# Patient Record
Sex: Male | Born: 1965
Health system: Southern US, Community
[De-identification: ages and names within clinical notes are randomized; demographics above are authoritative.]

## PROBLEM LIST (undated history)

## (undated) DIAGNOSIS — E785 Hyperlipidemia, unspecified: Secondary | ICD-10-CM

## (undated) DIAGNOSIS — I10 Essential (primary) hypertension: Secondary | ICD-10-CM

## (undated) DIAGNOSIS — I739 Peripheral vascular disease, unspecified: Secondary | ICD-10-CM

## (undated) DIAGNOSIS — A630 Anogenital (venereal) warts: Secondary | ICD-10-CM

## (undated) DIAGNOSIS — M8700 Idiopathic aseptic necrosis of unspecified bone: Secondary | ICD-10-CM

## (undated) DIAGNOSIS — J449 Chronic obstructive pulmonary disease, unspecified: Secondary | ICD-10-CM

## (undated) DIAGNOSIS — T7840XA Allergy, unspecified, initial encounter: Secondary | ICD-10-CM

## (undated) HISTORY — DX: Allergy, unspecified, initial encounter: T78.40XA

## (undated) HISTORY — DX: Chronic obstructive pulmonary disease, unspecified: J44.9

## (undated) HISTORY — DX: Peripheral vascular disease, unspecified: I73.9

## (undated) HISTORY — DX: Anogenital (venereal) warts: A63.0

## (undated) HISTORY — DX: Essential (primary) hypertension: I10

## (undated) HISTORY — DX: Hyperlipidemia, unspecified: E78.5

## (undated) HISTORY — PX: HERNIA REPAIR: SHX51

## (undated) SURGERY — Surgical Case
Anesthesia: *Unknown

---
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
## 1998-04-22 ENCOUNTER — Emergency Department (HOSPITAL_COMMUNITY): Admission: EM | Admit: 1998-04-22 | Discharge: 1998-04-22 | Payer: Self-pay | Admitting: Emergency Medicine

## 1998-04-22 ENCOUNTER — Encounter: Payer: Self-pay | Admitting: Emergency Medicine

## 2000-09-11 ENCOUNTER — Observation Stay (HOSPITAL_COMMUNITY): Admission: EM | Admit: 2000-09-11 | Discharge: 2000-09-11 | Payer: Self-pay | Admitting: Emergency Medicine

## 2000-09-11 ENCOUNTER — Encounter: Payer: Self-pay | Admitting: Surgery

## 2001-01-30 ENCOUNTER — Emergency Department (HOSPITAL_COMMUNITY): Admission: AC | Admit: 2001-01-30 | Discharge: 2001-01-30 | Payer: Self-pay

## 2001-02-11 ENCOUNTER — Emergency Department (HOSPITAL_COMMUNITY): Admission: EM | Admit: 2001-02-11 | Discharge: 2001-02-11 | Payer: Self-pay | Admitting: Emergency Medicine

## 2003-07-24 ENCOUNTER — Observation Stay (HOSPITAL_COMMUNITY): Admission: RE | Admit: 2003-07-24 | Discharge: 2003-07-25 | Payer: Self-pay | Admitting: General Surgery

## 2003-09-25 ENCOUNTER — Encounter: Admission: RE | Admit: 2003-09-25 | Discharge: 2003-09-25 | Payer: Self-pay | Admitting: Family Medicine

## 2004-07-17 ENCOUNTER — Ambulatory Visit: Payer: Self-pay | Admitting: Family Medicine

## 2004-08-01 ENCOUNTER — Ambulatory Visit: Payer: Self-pay | Admitting: Family Medicine

## 2004-08-05 ENCOUNTER — Encounter: Admission: RE | Admit: 2004-08-05 | Discharge: 2004-08-05 | Payer: Self-pay | Admitting: Family Medicine

## 2004-09-12 ENCOUNTER — Ambulatory Visit: Payer: Self-pay | Admitting: Family Medicine

## 2005-11-19 ENCOUNTER — Ambulatory Visit: Payer: Self-pay | Admitting: Family Medicine

## 2006-03-24 ENCOUNTER — Ambulatory Visit: Payer: Self-pay | Admitting: Family Medicine

## 2006-03-26 ENCOUNTER — Encounter: Admission: RE | Admit: 2006-03-26 | Discharge: 2006-03-26 | Payer: Self-pay | Admitting: Family Medicine

## 2011-05-26 ENCOUNTER — Encounter (HOSPITAL_COMMUNITY): Payer: Self-pay | Admitting: Pharmacy Technician

## 2011-05-26 ENCOUNTER — Other Ambulatory Visit (HOSPITAL_COMMUNITY): Payer: Self-pay | Admitting: Orthopaedic Surgery

## 2011-05-27 ENCOUNTER — Other Ambulatory Visit (HOSPITAL_COMMUNITY): Payer: Self-pay

## 2011-05-27 ENCOUNTER — Encounter (HOSPITAL_COMMUNITY)
Admission: RE | Admit: 2011-05-27 | Discharge: 2011-05-27 | Disposition: A | Discharge status: Home / Self Care | Payer: BC Managed Care – PPO | Source: Ambulatory Visit | Attending: Orthopaedic Surgery | Admitting: Orthopaedic Surgery

## 2011-05-27 ENCOUNTER — Other Ambulatory Visit: Payer: Self-pay

## 2011-05-27 ENCOUNTER — Ambulatory Visit (HOSPITAL_COMMUNITY)
Admission: RE | Admit: 2011-05-27 | Discharge: 2011-05-27 | Disposition: A | Discharge status: Home / Self Care | Payer: BC Managed Care – PPO | Source: Ambulatory Visit | Attending: Orthopaedic Surgery | Admitting: Orthopaedic Surgery

## 2011-05-27 ENCOUNTER — Encounter (HOSPITAL_COMMUNITY): Payer: Self-pay

## 2011-05-27 DIAGNOSIS — Z01818 Encounter for other preprocedural examination: Secondary | ICD-10-CM | POA: Insufficient documentation

## 2011-05-27 DIAGNOSIS — Z01812 Encounter for preprocedural laboratory examination: Secondary | ICD-10-CM | POA: Insufficient documentation

## 2011-05-27 DIAGNOSIS — Z01810 Encounter for preprocedural cardiovascular examination: Secondary | ICD-10-CM | POA: Insufficient documentation

## 2011-05-27 HISTORY — DX: Idiopathic aseptic necrosis of unspecified bone: M87.00

## 2011-05-27 LAB — CBC
MCH: 29.8 pg (ref 26.0–34.0)
MCHC: 33.4 g/dL (ref 30.0–36.0)
Platelets: 266 10*3/uL (ref 150–400)

## 2011-05-27 LAB — PROTIME-INR
INR: 0.93 (ref 0.00–1.49)
Prothrombin Time: 12.7 seconds (ref 11.6–15.2)

## 2011-05-27 LAB — SURGICAL PCR SCREEN: Staphylococcus aureus: NEGATIVE

## 2011-05-27 LAB — BASIC METABOLIC PANEL
Calcium: 9.9 mg/dL (ref 8.4–10.5)
GFR calc Af Amer: >90 mL/min (ref >90)
GFR calc non Af Amer: 89 mL/min — ABNORMAL LOW (ref >90)
Potassium: 3.8 mEq/L (ref 3.5–5.1)
Sodium: 138 mEq/L (ref 135–145)

## 2011-05-27 LAB — URINALYSIS, ROUTINE W REFLEX MICROSCOPIC
Hgb urine dipstick: NEGATIVE
Leukocytes, UA: NEGATIVE
Nitrite: NEGATIVE
Protein, ur: NEGATIVE mg/dL
Urobilinogen, UA: 0.2 mg/dL (ref 0.0–1.0)

## 2011-05-27 NOTE — Patient Instructions (Signed)
[address] TREVEON BOURCIER  05/27/2011   Your procedure is scheduled on: 05-30-2011  Report to [facility] at 0530 AM.  Call this number if you have problems the morning of surgery: [phone]   Remember:   Do not eat food:After Midnight.  May have clear liquids:until Midnight .  Clear liquids include soda, tea, black coffee, apple or grape juice, broth.  Take these medicines the morning of surgery with A SIP OF WATER: hydrocodone if needed   Do not wear jewelry,.  Do not wear lotions, powders,     Do not bring valuables to the hospital.  Contacts, dentures or bridgework may not be worn into surgery.  Leave suitcase in the car. After surgery it may be brought to your room.  For patients admitted to the hospital, checkout time is 11:00 AM the day of discharge.   Patients discharged the day of surgery will not be allowed to drive home.   Special Instructions: CHG Shower Use Special Wash: 1/2 bottle night before surgery and 1/2 bottle morning of surgery.neck down avoid private area   Please read over the following fact sheets that you were given: MRSA Information  Cain Sieve, rn [facility] pre op nurse phone number [phone]

## 2011-05-30 ENCOUNTER — Encounter (HOSPITAL_COMMUNITY): Payer: Self-pay | Admitting: *Deleted

## 2011-05-30 ENCOUNTER — Inpatient Hospital Stay (HOSPITAL_COMMUNITY): Payer: BC Managed Care – PPO

## 2011-05-30 ENCOUNTER — Inpatient Hospital Stay (HOSPITAL_COMMUNITY): Payer: BC Managed Care – PPO | Admitting: Anesthesiology

## 2011-05-30 ENCOUNTER — Inpatient Hospital Stay (HOSPITAL_COMMUNITY)
Admission: RE | Admit: 2011-05-30 | Discharge: 2011-06-03 | DRG: 818 | Disposition: A | Discharge status: Home Health | Payer: BC Managed Care – PPO | Source: Ambulatory Visit | Attending: Orthopaedic Surgery | Admitting: Orthopaedic Surgery

## 2011-05-30 ENCOUNTER — Encounter (HOSPITAL_COMMUNITY): Payer: Self-pay | Admitting: Anesthesiology

## 2011-05-30 ENCOUNTER — Encounter (HOSPITAL_COMMUNITY)
Admission: RE | Disposition: A | Discharge status: Home Health | Payer: Self-pay | Source: Ambulatory Visit | Attending: Orthopaedic Surgery

## 2011-05-30 DIAGNOSIS — M87051 Idiopathic aseptic necrosis of right femur: Secondary | ICD-10-CM

## 2011-05-30 DIAGNOSIS — M87059 Idiopathic aseptic necrosis of unspecified femur: Principal | ICD-10-CM | POA: Diagnosis present

## 2011-05-30 DIAGNOSIS — F17200 Nicotine dependence, unspecified, uncomplicated: Secondary | ICD-10-CM | POA: Diagnosis present

## 2011-05-30 HISTORY — PX: TOTAL HIP ARTHROPLASTY: SHX124

## 2011-05-30 LAB — TYPE AND SCREEN
ABO/RH(D): A POS
Antibody Screen: NEGATIVE

## 2011-05-30 LAB — ABO/RH: ABO/RH(D): A POS

## 2011-05-30 SURGERY — ARTHROPLASTY, HIP, TOTAL, ANTERIOR APPROACH
Anesthesia: General | Site: Hip | Laterality: Right | Wound class: Clean

## 2011-05-30 MED ORDER — NALOXONE HCL 0.4 MG/ML IJ SOLN: 0.4000 mg | INTRAMUSCULAR | Status: DC | PRN

## 2011-05-30 MED ORDER — METOCLOPRAMIDE HCL 5 MG/ML IJ SOLN: 5.0000 mg | Freq: Three times a day (TID) | INTRAMUSCULAR | Status: DC | PRN

## 2011-05-30 MED ORDER — SENNA 8.6 MG PO TABS
1.0000 | ORAL_TABLET | Freq: Two times a day (BID) | ORAL | Status: DC
  Administered 2011-05-31 – 2011-06-03 (×7): 8.6 mg via ORAL
  Filled 2011-05-30 (×7): qty 1

## 2011-05-30 MED ORDER — HYDROCODONE-ACETAMINOPHEN 5-325 MG PO TABS: 1.0000 | ORAL_TABLET | ORAL | Status: DC | PRN

## 2011-05-30 MED ORDER — ONDANSETRON HCL 4 MG/2ML IJ SOLN: 4.0000 mg | Freq: Four times a day (QID) | INTRAMUSCULAR | Status: DC | PRN

## 2011-05-30 MED ORDER — DIPHENHYDRAMINE HCL 12.5 MG/5ML PO ELIX: 12.5000 mg | ORAL_SOLUTION | Freq: Four times a day (QID) | ORAL | Status: DC | PRN

## 2011-05-30 MED ORDER — MIDAZOLAM HCL 5 MG/5ML IJ SOLN
INTRAMUSCULAR | Status: DC | PRN
  Administered 2011-05-30: 2 mg via INTRAVENOUS

## 2011-05-30 MED ORDER — METOCLOPRAMIDE HCL 10 MG PO TABS: 5.0000 mg | ORAL_TABLET | Freq: Three times a day (TID) | ORAL | Status: DC | PRN

## 2011-05-30 MED ORDER — LACTATED RINGERS IV SOLN
INTRAVENOUS | Status: DC | PRN
  Administered 2011-05-30 (×3): via INTRAVENOUS

## 2011-05-30 MED ORDER — ACETAMINOPHEN 650 MG RE SUPP: 650.0000 mg | Freq: Four times a day (QID) | RECTAL | Status: DC | PRN

## 2011-05-30 MED ORDER — METHOCARBAMOL 500 MG PO TABS
500.0000 mg | ORAL_TABLET | Freq: Four times a day (QID) | ORAL | Status: DC | PRN
  Administered 2011-05-31 – 2011-06-03 (×9): 500 mg via ORAL
  Filled 2011-05-30 (×9): qty 1

## 2011-05-30 MED ORDER — PROPOFOL 10 MG/ML IV EMUL
INTRAVENOUS | Status: DC | PRN
  Administered 2011-05-30: 250 mg via INTRAVENOUS
  Administered 2011-05-30: 50 mg via INTRAVENOUS

## 2011-05-30 MED ORDER — ACETAMINOPHEN 325 MG PO TABS
650.0000 mg | ORAL_TABLET | Freq: Four times a day (QID) | ORAL | Status: DC | PRN
  Administered 2011-05-31 – 2011-06-03 (×2): 650 mg via ORAL
  Filled 2011-05-30 (×2): qty 2

## 2011-05-30 MED ORDER — CEFAZOLIN SODIUM-DEXTROSE 2-3 GM-% IV SOLR
2.0000 g | INTRAVENOUS | Status: AC
  Administered 2011-05-30: 2 g via INTRAVENOUS

## 2011-05-30 MED ORDER — ONDANSETRON HCL 4 MG PO TABS: 4.0000 mg | ORAL_TABLET | Freq: Four times a day (QID) | ORAL | Status: DC | PRN

## 2011-05-30 MED ORDER — PROMETHAZINE HCL 25 MG/ML IJ SOLN: 6.2500 mg | INTRAMUSCULAR | Status: DC | PRN

## 2011-05-30 MED ORDER — ZOLPIDEM TARTRATE 5 MG PO TABS: 5.0000 mg | ORAL_TABLET | Freq: Every evening | ORAL | Status: DC | PRN

## 2011-05-30 MED ORDER — LACTATED RINGERS IV SOLN: INTRAVENOUS | Status: DC

## 2011-05-30 MED ORDER — NICOTINE 21 MG/24HR TD PT24
21.0000 mg | MEDICATED_PATCH | Freq: Every day | TRANSDERMAL | Status: DC
  Administered 2011-05-30 – 2011-06-02 (×4): 21 mg via TRANSDERMAL
  Filled 2011-05-30 (×5): qty 1

## 2011-05-30 MED ORDER — DIPHENHYDRAMINE HCL 50 MG/ML IJ SOLN: 12.5000 mg | Freq: Four times a day (QID) | INTRAMUSCULAR | Status: DC | PRN

## 2011-05-30 MED ORDER — MORPHINE SULFATE (PF) 1 MG/ML IV SOLN
INTRAVENOUS | Status: DC
  Administered 2011-05-30: 16.5 mg via INTRAVENOUS
  Administered 2011-05-30: 12 mg via INTRAVENOUS
  Administered 2011-05-30 (×2): via INTRAVENOUS
  Administered 2011-05-31: 10.5 mg via INTRAVENOUS
  Administered 2011-05-31: 12.95 mg via INTRAVENOUS
  Administered 2011-05-31: 19.5 mg via INTRAVENOUS
  Administered 2011-05-31: 4.5 mg via INTRAVENOUS
  Administered 2011-05-31: 8.51 mg via INTRAVENOUS
  Administered 2011-05-31: 3 mg via INTRAVENOUS
  Administered 2011-06-01: 6 mg via INTRAVENOUS
  Administered 2011-06-01: 25 mg via INTRAVENOUS
  Administered 2011-06-01: 4.5 mg via INTRAVENOUS
  Administered 2011-06-01: 3 mg via INTRAVENOUS
  Administered 2011-06-01: 7.5 mg via INTRAVENOUS
  Administered 2011-06-01: 9 mg via INTRAVENOUS
  Administered 2011-06-02: 07:00:00 via INTRAVENOUS
  Administered 2011-06-02: 3.73 mg via INTRAVENOUS
  Administered 2011-06-02: 3 mg via INTRAVENOUS
  Filled 2011-05-30 (×8): qty 25

## 2011-05-30 MED ORDER — ACETAMINOPHEN 10 MG/ML IV SOLN
INTRAVENOUS | Status: DC | PRN
  Administered 2011-05-30: 1000 mg via INTRAVENOUS

## 2011-05-30 MED ORDER — OXYCODONE HCL 5 MG PO TABS
5.0000 mg | ORAL_TABLET | ORAL | Status: DC | PRN
  Administered 2011-06-02 (×4): 10 mg via ORAL
  Administered 2011-06-02: 5 mg via ORAL
  Administered 2011-06-03 (×2): 10 mg via ORAL
  Filled 2011-05-30 (×7): qty 2

## 2011-05-30 MED ORDER — SODIUM CHLORIDE 0.9 % IJ SOLN: 9.0000 mL | INTRAMUSCULAR | Status: DC | PRN

## 2011-05-30 MED ORDER — PHENOL 1.4 % MT LIQD: 1.0000 | OROMUCOSAL | Status: DC | PRN

## 2011-05-30 MED ORDER — NEOSTIGMINE METHYLSULFATE 1 MG/ML IJ SOLN
INTRAMUSCULAR | Status: DC | PRN
  Administered 2011-05-30: 5 mg via INTRAVENOUS

## 2011-05-30 MED ORDER — MORPHINE SULFATE 2 MG/ML IJ SOLN: 1.0000 mg | INTRAMUSCULAR | Status: DC | PRN

## 2011-05-30 MED ORDER — ROCURONIUM BROMIDE 100 MG/10ML IV SOLN
INTRAVENOUS | Status: DC | PRN
  Administered 2011-05-30 (×2): 20 mg via INTRAVENOUS
  Administered 2011-05-30: 50 mg via INTRAVENOUS

## 2011-05-30 MED ORDER — CEFAZOLIN SODIUM 1-5 GM-% IV SOLN
1.0000 g | Freq: Four times a day (QID) | INTRAVENOUS | Status: AC
  Administered 2011-05-30 (×2): 1 g via INTRAVENOUS
  Filled 2011-05-30 (×3): qty 50

## 2011-05-30 MED ORDER — ALUM & MAG HYDROXIDE-SIMETH 200-200-20 MG/5ML PO SUSP: 30.0000 mL | ORAL | Status: DC | PRN

## 2011-05-30 MED ORDER — DIPHENHYDRAMINE HCL 12.5 MG/5ML PO ELIX
12.5000 mg | ORAL_SOLUTION | ORAL | Status: DC | PRN
  Filled 2011-05-30: qty 10

## 2011-05-30 MED ORDER — MENTHOL 3 MG MT LOZG: 1.0000 | LOZENGE | OROMUCOSAL | Status: DC | PRN

## 2011-05-30 MED ORDER — HYDROMORPHONE HCL PF 1 MG/ML IJ SOLN
0.2500 mg | INTRAMUSCULAR | Status: DC | PRN
  Administered 2011-05-30 (×2): 0.5 mg via INTRAVENOUS

## 2011-05-30 MED ORDER — DEXTROSE 5 % IV SOLN
500.0000 mg | Freq: Four times a day (QID) | INTRAVENOUS | Status: DC | PRN
  Administered 2011-05-30: 500 mg via INTRAVENOUS
  Filled 2011-05-30: qty 5

## 2011-05-30 MED ORDER — ONDANSETRON HCL 4 MG/2ML IJ SOLN
INTRAMUSCULAR | Status: DC | PRN
  Administered 2011-05-30: 4 mg via INTRAVENOUS

## 2011-05-30 MED ORDER — HYDROMORPHONE HCL PF 1 MG/ML IJ SOLN
INTRAMUSCULAR | Status: DC | PRN
  Administered 2011-05-30 (×2): 1 mg via INTRAVENOUS

## 2011-05-30 MED ORDER — RIVAROXABAN 10 MG PO TABS
10.0000 mg | ORAL_TABLET | Freq: Every day | ORAL | Status: DC
  Administered 2011-05-31 – 2011-06-03 (×4): 10 mg via ORAL
  Filled 2011-05-30 (×4): qty 1

## 2011-05-30 MED ORDER — LIDOCAINE HCL (CARDIAC) 20 MG/ML IV SOLN
INTRAVENOUS | Status: DC | PRN
  Administered 2011-05-30: 50 mg via INTRAVENOUS

## 2011-05-30 MED ORDER — SUFENTANIL CITRATE 50 MCG/ML IV SOLN
INTRAVENOUS | Status: DC | PRN
  Administered 2011-05-30: 5 ug via INTRAVENOUS
  Administered 2011-05-30: 15 ug via INTRAVENOUS
  Administered 2011-05-30 (×2): 10 ug via INTRAVENOUS
  Administered 2011-05-30: 15 ug via INTRAVENOUS
  Administered 2011-05-30 (×4): 10 ug via INTRAVENOUS
  Administered 2011-05-30: 5 ug via INTRAVENOUS

## 2011-05-30 MED ORDER — HETASTARCH-ELECTROLYTES 6 % IV SOLN
INTRAVENOUS | Status: DC | PRN
  Administered 2011-05-30: 08:00:00 via INTRAVENOUS

## 2011-05-30 MED ORDER — FERROUS SULFATE 325 (65 FE) MG PO TABS
325.0000 mg | ORAL_TABLET | Freq: Three times a day (TID) | ORAL | Status: DC
  Administered 2011-05-31 – 2011-06-03 (×10): 325 mg via ORAL
  Filled 2011-05-30 (×14): qty 1

## 2011-05-30 MED ORDER — SODIUM CHLORIDE 0.9 % IV SOLN
INTRAVENOUS | Status: DC
  Administered 2011-05-30 – 2011-05-31 (×2): via INTRAVENOUS
  Administered 2011-05-31: 1000 mL via INTRAVENOUS
  Administered 2011-06-01 (×2): via INTRAVENOUS

## 2011-05-30 MED ORDER — GLYCOPYRROLATE 0.2 MG/ML IJ SOLN
INTRAMUSCULAR | Status: DC | PRN
  Administered 2011-05-30: .8 mg via INTRAVENOUS

## 2011-05-30 SURGICAL SUPPLY — 37 items
BAG ZIPLOCK 12X15 (MISCELLANEOUS) ×4 IMPLANT
BLADE SAW SGTL 18X1.27X75 (BLADE) ×2 IMPLANT
CELLS DAT CNTRL 66122 CELL SVR (MISCELLANEOUS) ×1 IMPLANT
CLOTH BEACON ORANGE TIMEOUT ST (SAFETY) ×2 IMPLANT
DRAPE C-ARM 42X72 X-RAY (DRAPES) ×2 IMPLANT
DRAPE STERI IOBAN 125X83 (DRAPES) ×2 IMPLANT
DRAPE U-SHAPE 47X51 STRL (DRAPES) ×6 IMPLANT
DRESSING ALLEVYN BORDER HEEL (GAUZE/BANDAGES/DRESSINGS) ×2 IMPLANT
DRSG MEPILEX BORDER 4X8 (GAUZE/BANDAGES/DRESSINGS) ×2 IMPLANT
ELECT BLADE TIP CTD 4 INCH (ELECTRODE) ×2 IMPLANT
ELECT REM PT RETURN 9FT ADLT (ELECTROSURGICAL) ×2
ELECTRODE REM PT RTRN 9FT ADLT (ELECTROSURGICAL) ×1 IMPLANT
EVACUATOR 1/8 PVC DRAIN (DRAIN) IMPLANT
FACESHIELD LNG OPTICON STERILE (SAFETY) ×8 IMPLANT
GAUZE XEROFORM 1X8 LF (GAUZE/BANDAGES/DRESSINGS) ×2 IMPLANT
GAUZE XEROFORM 5X9 LF (GAUZE/BANDAGES/DRESSINGS) ×2 IMPLANT
GLOVE BIO SURGEON STRL SZ7 (GLOVE) ×2 IMPLANT
GLOVE BIO SURGEON STRL SZ7.5 (GLOVE) ×2 IMPLANT
GLOVE BIOGEL PI IND STRL 7.5 (GLOVE) IMPLANT
GLOVE BIOGEL PI IND STRL 8 (GLOVE) ×1 IMPLANT
GLOVE BIOGEL PI INDICATOR 7.5 (GLOVE)
GLOVE BIOGEL PI INDICATOR 8 (GLOVE) ×1
GLOVE ECLIPSE 7.0 STRL STRAW (GLOVE) ×2 IMPLANT
GOWN STRL REIN XL XLG (GOWN DISPOSABLE) ×4 IMPLANT
KIT BASIN OR (CUSTOM PROCEDURE TRAY) ×2 IMPLANT
PACK TOTAL JOINT (CUSTOM PROCEDURE TRAY) ×2 IMPLANT
PADDING CAST COTTON 6X4 STRL (CAST SUPPLIES) ×2 IMPLANT
RTRCTR WOUND ALEXIS 18CM MED (MISCELLANEOUS) ×2
STAPLER SKIN PROX WIDE 3.9 (STAPLE) IMPLANT
STEM CORAIL KA12 (Stem) ×2 IMPLANT
SUT ETHIBOND NAB CT1 #1 30IN (SUTURE) ×4 IMPLANT
SUT VIC AB 1 CT1 36 (SUTURE) ×4 IMPLANT
SUT VIC AB 2-0 CT1 27 (SUTURE) ×2
SUT VIC AB 2-0 CT1 TAPERPNT 27 (SUTURE) ×2 IMPLANT
TOWEL OR 17X26 10 PK STRL BLUE (TOWEL DISPOSABLE) ×4 IMPLANT
TOWEL OR NON WOVEN STRL DISP B (DISPOSABLE) ×2 IMPLANT
TRAY FOLEY CATH 14FRSI W/METER (CATHETERS) ×2 IMPLANT

## 2011-05-30 NOTE — Anesthesia Postprocedure Evaluation (Signed)
  Anesthesia Post-op Note  Patient: Jerome Thomas  Procedure(s) Performed:  TOTAL HIP ARTHROPLASTY ANTERIOR APPROACH - Right Total Hip Arthroplasty, Direct Anterior Approach (C-Arm)  Patient Location: PACU  Anesthesia Type: General  Level of Consciousness: awake and alert   Airway and Oxygen Therapy: Patient Spontanous Breathing  Post-op Pain: mild  Post-op Assessment: Post-op Vital signs reviewed, Patient's Cardiovascular Status Stable, Respiratory Function Stable, Patent Airway and No signs of Nausea or vomiting  Post-op Vital Signs: stable  Complications: No apparent anesthesia complications

## 2011-05-30 NOTE — Anesthesia Preprocedure Evaluation (Addendum)
Anesthesia Evaluation  Patient identified by MRN, date of birth, ID band Patient awake    Reviewed: Allergy & Precautions, H&P , NPO status , Patient's Chart, lab work & pertinent test results  Airway Mallampati: II TM Distance: >3 FB Neck ROM: full    Dental No notable dental hx. (+) Dental Advisory Given and Teeth Intact   Pulmonary neg pulmonary ROS, Current Smoker,  clear to auscultation  Pulmonary exam normal       Cardiovascular Exercise Tolerance: Good neg cardio ROS regular Normal    Neuro/Psych Negative Neurological ROS  Negative Psych ROS   GI/Hepatic negative GI ROS, Neg liver ROS,   Endo/Other  Negative Endocrine ROS  Renal/GU negative Renal ROS  Genitourinary negative   Musculoskeletal   Abdominal   Peds  Hematology negative hematology ROS (+)   Anesthesia Other Findings   Reproductive/Obstetrics negative OB ROS                          Anesthesia Physical Anesthesia Plan  ASA: II  Anesthesia Plan: General   Post-op Pain Management:    Induction: Intravenous  Airway Management Planned: Oral ETT  Additional Equipment:   Intra-op Plan:   Post-operative Plan:   Informed Consent: I have reviewed the patients History and Physical, chart, labs and discussed the procedure including the risks, benefits and alternatives for the proposed anesthesia with the patient or authorized representative who has indicated his/her understanding and acceptance.   Dental Advisory Given Interior and spatial designer)  Plan Discussed with: CRNA and Surgeon  Anesthesia Plan Comments:       Anesthesia Quick Evaluation

## 2011-05-30 NOTE — Plan of Care (Signed)
Problem: Consults Goal: Diagnosis- Total Joint Replacement Right anterior hip replacement

## 2011-05-30 NOTE — Brief Op Note (Signed)
05/30/2011  9:54 AM  PATIENT:  Anola Gurney  45 y.o. male  PRE-OPERATIVE DIAGNOSIS:  Right hip avascular necrosis  POST-OPERATIVE DIAGNOSIS:  Right hip avascular necrosis  PROCEDURE:  Procedure(s): TOTAL HIP ARTHROPLASTY ANTERIOR APPROACH  SURGEON:  Surgeon(s): Kathryne Hitch  PHYSICIAN ASSISTANT:   ASSISTANTS: none   ANESTHESIA:   general  EBL:  Total I/O In: 2500 [I.V.:2000; IV Piggyback:500] Out: 650 [Urine:150; Blood:500]  BLOOD ADMINISTERED:none  DRAINS: none   LOCAL MEDICATIONS USED:  NONE  SPECIMEN:  No Specimen  DISPOSITION OF SPECIMEN:  N/A  COUNTS:  YES  TOURNIQUET:  * No tourniquets in log *  DICTATION: .Other Dictation: Dictation Number [id number]  PLAN OF CARE: Admit to inpatient   PATIENT DISPOSITION:  PACU - hemodynamically stable.   Delay start of Pharmacological VTE agent (>24hrs) due to surgical blood loss or risk of bleeding:  {YES/NO/NOT APPLICABLE:20182

## 2011-05-30 NOTE — H&P (Signed)
Jerome Thomas is an 45 Thomas.o. male.   Chief Complaint:   Right Hip Pain HPI:   45 yo male with worsening right hip pain for greater than 1 year.  MRI eventually obtained showing avascular necrosis right hip.  With this continuing to get worse and effecting his activities of daily living and quality of life.  He now wishes to proceed with a right total hip replacement.  The risks and benefits have been thoroughly discussed including acute blood loss, nerve injury, DVT and PE.  Our goal is decreased pain and improved function as well as improved quality of life.  Past Medical History  Diagnosis Date  . Avascular necrosis     right hip    Past Surgical History  Procedure Date  . Arthroscopy knee w/ drilling 1980's    left knee  . Hernia repair yrs ago    x 2    History reviewed. No pertinent family history. Social History:  reports that he has been smoking Cigarettes.  He has a 29 pack-year smoking history. He has never used smokeless tobacco. He reports that he drinks alcohol. He reports that he does not use illicit drugs.  Allergies: No Known Allergies  Medications Prior to Admission  Medication Dose Route Frequency Provider Last Rate Last Dose  . ceFAZolin (ANCEF) IVPB 2 g/50 mL premix  2 g Intravenous 60 min Pre-Op Kathryne Hitch       No current outpatient prescriptions on file as of 05/30/2011.    Results for orders placed during the hospital encounter of 05/30/11 (from the past 48 hour(s))  TYPE AND SCREEN     Status: Normal   Collection Time   05/30/11  5:45 AM      Component Value Range Comment   ABO/RH(D) A POS      Antibody Screen NEG      Sample Expiration 06/02/2011     ABO/RH     Status: Normal (Preliminary result)   Collection Time   05/30/11  6:30 AM      Component Value Range Comment   ABO/RH(D) A POS      No results found.  Review of Systems  Musculoskeletal: Positive for joint pain.  All other systems reviewed and are negative.    Blood  pressure 137/89, pulse 91, temperature 97.4 F (36.3 C), temperature source Oral, resp. rate 18, SpO2 97.00%. Physical Exam  Constitutional: He is oriented to person, place, and time. He appears well-developed and well-nourished.  HENT:  Head: Normocephalic and atraumatic.  Eyes: Pupils are equal, round, and reactive to light.  Neck: Normal range of motion. Neck supple.  Cardiovascular: Normal rate and regular rhythm.   Respiratory: Effort normal and breath sounds normal.  GI: Soft. Bowel sounds are normal.  Musculoskeletal:       Right hip: He exhibits decreased range of motion, decreased strength and bony tenderness.  Neurological: He is alert and oriented to person, place, and time.  Skin: Skin is warm and dry.  Psychiatric: He has a normal mood and affect.     Assessment/Plan To the OR today for a right total hip replacement, then admission as inpatient.  Jerome Thomas 05/30/2011, 7:03 AM

## 2011-05-30 NOTE — Transfer of Care (Signed)
Immediate Anesthesia Transfer of Care Note  Patient: Jerome Thomas  Procedure(s) Performed:  TOTAL HIP ARTHROPLASTY ANTERIOR APPROACH - Right Total Hip Arthroplasty, Direct Anterior Approach (C-Arm)  Patient Location: PACU  Anesthesia Type: General  Level of Consciousness: awake, oriented, sedated and responds to stimulation  Airway & Oxygen Therapy: Patient Spontanous Breathing and Patient connected to face mask oxygen  Post-op Assessment: Report given to PACU RN and Post -op Vital signs reviewed and stable  Post vital signs: stable  Complications: No apparent anesthesia complications

## 2011-05-30 NOTE — Op Note (Signed)
Jerome Thomas, Jerome Thomas NO.:  [id number]  MEDICAL RECORD NO.:  [id number]  LOCATION:  1614                         FACILITY:  [facility]  PHYSICIAN:  Vanita Panda. Magnus Ivan, M.D.DATE OF BIRTH:  Feb 05, 1966  DATE OF PROCEDURE:  05/30/2011 DATE OF DISCHARGE:                              OPERATIVE REPORT   PREOPERATIVE DIAGNOSIS:  Avascular necrosis, right hip.  POSTOPERATIVE DIAGNOSIS:  Avascular necrosis, right hip.  PROCEDURE:  Right total hip arthroplasty through direct anterior approach.  IMPLANTS:  DePuy Pinnacle sector acetabular component size 56, size 36 neutral +4 polyethylene liner, size 15 Corail KA femoral component, size 36 + 1.5 ceramic hip ball.  SURGEON:  Vanita Panda. Magnus Ivan, M.D.  ANESTHESIA:  General.  ANTIBIOTICS:  2 g IV Ancef.  BLOOD LOSS:  500 mL.  COMPLICATIONS:  None.  INDICATIONS:  Jerome Thomas is a 45 year old gentleman well known to me.  He has had an excess of 300 pounds and developed right hip pain for over a year.  I saw him in the office and x-rays were consistent with avascular necrosis, but I did obtain an MRI, and I did confirm the diagnosis of avascular necrosis.  He has had a lot of problems working daily with the hip, causing excruciating pain by the end of the day, especially even when he had been driving any type of distances.  It had gotten to where it was repeatedly affecting his activities of daily living and he wished to proceed with a total hip arthroplasty.  He understands the risks and benefits of the surgery and does wish to proceed.  PROCEDURE DESCRIPTION:  After informed consent was obtained, appropriate right hip was marked.  He was brought to the operating room and general anesthesia was obtained while he was on the stretcher.  Foley catheter was placed as well as traction boots on his feet, and he was placed on the Hana fracture table.  Both feet were placed in-line skeletal traction devices, but no  traction was applied.  His right hip was then prepped and draped with DuraPrep and sterile drapes including sterile shower curtain drape.  A time-out was called and he has been identified as the correct patient, correct right hip.  I then made an incision just distal and posterior to the anterior superior iliac spine and carried this obliquely down the leg.  I dissected down to the soft tissues and identified the tensor fascia lata, which I divided longitudinally.  I proceeded with a direct anterior approach to the hip.  Cobra retractors placed around the lateral neck and then up under the rectus femoris. The cobra retractors placed medially.  I cauterized the lateral femoral circumflex vessels, and then divided the hip capsule in a L-shaped format.  I put the Cobra retractors within the hip capsule and then made my femoral neck cut just proximal to the lesser trochanter.  I removed the femoral head in its entirety and found he did have disease consistent with avascular necrosis.  Next, I began preparing the acetabular component of the acetabulum.  I put retractors laterally and medially and cleaned the acetabular debris.  I then began reaming from size 46 reamer, all the way up  to a size 55 with the last few reamers placed under direct fluoroscopic guidance.  I then placed a size 56 acetabular component under direct visualization and fluoroscopy as appropriate possible inclination, abduction, and anteversion.  Next, attention was turned to the femur.  With all traction off the bed, I placed a temporary hook underneath the vastus ridge.  The hip was externally rotated 90 degrees, extended in adduction.  I cleaned the debris around the greater trochanter, put the retractors medially and the tip of the greater trochanter, using a box cutting guide to open the femoral canal.  Then began broaching from a size 8 trial broach all the way to 12, which I felt was stable when we trialled; however,  when last taking out the broach, I felt that we needed to go up the size.  We certainly went up all the way to a size 15, which actually gave a good fill, was felt to be much stable, so we then placed the real size 15 standard offset Corail stem from DePuy.  We replaced the real 36 + 1.5 ceramic hip ball and reduced this in the acetabulum.  The polyethylene liner was placed before this as well.  I was able to measure his leg lengths and they were equal.  He had no Shuck.  We could externally rotate to 90 degrees, internally rotate to 45 degrees.  It was stable. I then copiously irrigated the deep tissues and closed the arthrotomy with interrupted #1 Ethibond suture.  I placed a running #1 Vicryl in the tensor fascia lata, and then closed the subcutaneous tissue with 2-0 Vicryl, and interrupted staples used to close the skin.  The patient was then taken off the Hana table, awakened, extubated, taken to recovery in stable condition.  All final counts correct and no complications noted.     Vanita Panda. Magnus Ivan, M.D.     CYB/MEDQ  D:  05/30/2011  T:  05/30/2011  Job:  [id number]

## 2011-05-31 LAB — BASIC METABOLIC PANEL
CO2: 31 mEq/L (ref 19–32)
Chloride: 98 mEq/L (ref 96–112)
Creatinine, Ser: 0.96 mg/dL (ref 0.50–1.35)
Glucose, Bld: 130 mg/dL — ABNORMAL HIGH (ref 70–99)
Sodium: 134 mEq/L — ABNORMAL LOW (ref 135–145)

## 2011-05-31 LAB — CBC
Hemoglobin: 12.9 g/dL — ABNORMAL LOW (ref 13.0–17.0)
MCV: 91.5 fL (ref 78.0–100.0)
Platelets: 230 10*3/uL (ref 150–400)
RBC: 4.45 MIL/uL (ref 4.22–5.81)
WBC: 10.2 10*3/uL (ref 4.0–10.5)

## 2011-05-31 NOTE — Progress Notes (Signed)
Subjective: 1 Day Post-Op Procedure(s) (LRB): TOTAL HIP ARTHROPLASTY ANTERIOR APPROACH (Right) Patient reports pain as mild.    Objective: Vital signs in last 24 hours: Temp:  [97.3 F (36.3 C)-99.6 F (37.6 C)] 98.7 F (37.1 C) (12/15 0530) Pulse Rate:  [74-105] 78  (12/15 0530) Resp:  [10-20] 20  (12/15 0530) BP: (118-167)/(74-107) 121/75 mmHg (12/15 0530) SpO2:  [92 %-100 %] 99 % (12/15 0530) FiO2 (%):  [98 %] 98 % (12/14 1210) Weight:  [92.987 kg (205 lb)-137.44 kg (303 lb)] 205 lb (92.987 kg) (12/14 1112)  Intake/Output from previous day: 12/14 0701 - 12/15 0700 In: 4893.8 [P.O.:630; I.V.:3613.8; IV Piggyback:650] Out: 1625 [Urine:1125; Blood:500] Intake/Output this shift:     Basename 05/31/11 0430  HGB 12.9*    Basename 05/31/11 0430  WBC 10.2  RBC 4.45  HCT 40.7  PLT 230    Basename 05/31/11 0430  NA 134*  K 4.2  CL 98  CO2 31  BUN 8  CREATININE 0.96  GLUCOSE 130*  CALCIUM 8.5   No results found for this basename: LABPT:2,INR:2 in the last 72 hours  Sensation intact distally Intact pulses distally Incision: no drainage  Assessment/Plan: 1 Day Post-Op Procedure(s) (LRB): TOTAL HIP ARTHROPLASTY ANTERIOR APPROACH (Right) Up with therapy  Jerome Thomas 05/31/2011, 7:45 AM

## 2011-05-31 NOTE — Progress Notes (Signed)
Physical Therapy Evaluation Patient Details Name: Jerome Thomas MRN: [id number] DOB: 1965-09-29 Today's Date: 05/31/2011 1006 - 1040; EVAL Problem List:  Patient Active Problem List  Diagnoses  . Avascular necrosis of right femoral head    Past Medical History:  Past Medical History  Diagnosis Date  . Avascular necrosis     right hip  . No pertinent past medical history    Past Surgical History:  Past Surgical History  Procedure Date  . Arthroscopy knee w/ drilling 1980's    left knee  . Hernia repair yrs ago    x 2    PT Assessment/Plan/Recommendation PT Assessment Clinical Impression Statement: Pt with R THR presents with decreased R LE strength/ROM and decreased functional mobility.  Pt will benefit from skilled PT intervention to maxiimize IND for d/c home with family assist PT Recommendation/Assessment: Patient will need skilled PT in the acute care venue PT Problem List: Decreased strength;Decreased range of motion;Decreased activity tolerance;Decreased mobility;Decreased knowledge of use of DME;Pain PT Therapy Diagnosis : Difficulty walking PT Plan PT Frequency: 7X/week PT Treatment/Interventions: DME instruction;Gait training;Stair training;Functional mobility training;Therapeutic activities;Therapeutic exercise;Patient/family education PT Recommendation Recommendations for Other Services: OT consult Follow Up Recommendations: Home health PT Equipment Recommended: None recommended by PT PT Goals  Acute Rehab PT Goals PT Goal Formulation: With patient Time For Goal Achievement: 7 days Pt will go Supine/Side to Sit: with supervision PT Goal: Supine/Side to Sit - Progress: Not met Pt will go Sit to Supine/Side: with supervision PT Goal: Sit to Supine/Side - Progress: Not met Pt will go Sit to Stand: with supervision PT Goal: Sit to Stand - Progress: Not met Pt will go Stand to Sit: with supervision PT Goal: Stand to Sit - Progress: Not met Pt will Ambulate:  51 - 150 feet;with supervision;with rolling walker PT Goal: Ambulate - Progress: Not met Pt will Go Up / Down Stairs: 1-2 stairs;with min assist;with least restrictive assistive device PT Goal: Up/Down Stairs - Progress: Not met  PT Evaluation Precautions/Restrictions  Restrictions Other Position/Activity Restrictions: WBAT Prior Functioning  Home Living Lives With: Spouse Receives Help From: Family Type of Home: House Home Layout: One level Home Access: Stairs to enter Entrance Stairs-Rails: None Entrance Stairs-Number of Steps: 1 Prior Function Level of Independence: Independent with basic ADLs;Independent with transfers;Requires assistive device for independence Able to Take Stairs?: Yes Cognition Cognition Arousal/Alertness: Awake/alert Overall Cognitive Status: Appears within functional limits for tasks assessed Orientation Level: Oriented X4 Sensation/Coordination Coordination Gross Motor Movements are Fluid and Coordinated: Yes Extremity Assessment RUE Assessment RUE Assessment: Within Functional Limits LUE Assessment LUE Assessment: Within Functional Limits RLE Assessment RLE Assessment: Exceptions to [facility] all ranges secondary to pain) LLE Assessment LLE Assessment: Within Functional Limits Mobility (including Balance) Bed Mobility Bed Mobility: Yes Supine to Sit: 1: +2 Total assist;With rails Supine to Sit Details (indicate cue type and reason): Pt 60% with increased time and cues for technique/sequence Transfers Transfers: Yes Sit to Stand: 1: +2 Total assist;From bed;With upper extremity assist;From elevated surface Sit to Stand Details (indicate cue type and reason): cues for use of UEs and LE position Stand to Sit: 1: +2 Total assist;Without upper extremity assist;With armrests;To chair/3-in-1 Stand to Sit Details: cues for use of UEs and LE position Ambulation/Gait Ambulation/Gait: Yes Ambulation/Gait Assistance: 1: +2 Total  assist Ambulation/Gait Assistance Details (indicate cue type and reason): cues for posture, sequence, and position from RW. - pt 70% with assist required to advance R LE Ambulation Distance (Feet):  8 Feet Assistive device: Rolling walker Gait Pattern: Step-to pattern Gait velocity: slow Stairs: No    Exercise  Total Joint Exercises Ankle Circles/Pumps: AROM;Both;10 reps;Supine Heel Slides: AAROM;15 reps;Supine;Right Hip ABduction/ADduction: AAROM;10 reps;Supine;Right End of Session PT - End of Session Activity Tolerance: Patient limited by pain Patient left: in chair;with call bell in reach;with family/visitor present Nurse Communication: Mobility status for transfers;Mobility status for ambulation General Behavior During Session: [facility] for tasks performed Cognition: [facility] for tasks performed  Shirl Ludington 05/31/2011, 1:13 PM

## 2011-05-31 NOTE — Progress Notes (Signed)
Cm spoke with pt concerning d/c planning. Per pt choice [facility] to provide HHPT. Pt request wide  3n1, shower bench, and RW. Pt's mother to assist in home care.  Genevieve Norlander notified of referral. Elenore Paddy [phone]

## 2011-05-31 NOTE — Progress Notes (Signed)
Physical Therapy Treatment Patient Details Name: Jerome Thomas MRN: [id number] DOB: Oct 18, 1965 Today's Date: 05/31/2011 1430 - 1451; GT PT Assessment/Plan  PT - Assessment/Plan PT Plan: Discharge plan remains appropriate PT Frequency: 7X/week Follow Up Recommendations: Home health PT Equipment Recommended: None recommended by PT PT Goals  Acute Rehab PT Goals Time For Goal Achievement: 7 days Pt will go Supine/Side to Sit: with supervision Pt will go Sit to Supine/Side: with supervision PT Goal: Sit to Supine/Side - Progress: Progressing toward goal Pt will go Sit to Stand: with supervision PT Goal: Sit to Stand - Progress: Progressing toward goal Pt will go Stand to Sit: with supervision PT Goal: Stand to Sit - Progress: Progressing toward goal Pt will Ambulate: 51 - 150 feet;with supervision;with rolling walker PT Goal: Ambulate - Progress: Progressing toward goal  PT Treatment Precautions/Restrictions  Restrictions Weight Bearing Restrictions: No Other Position/Activity Restrictions: WBAT Mobility (including Balance) Bed Mobility Sit to Supine - Right: 3: Mod assist Transfers Sit to Stand: 1: +2 Total assist;With upper extremity assist;With armrests;From chair/3-in-1 Sit to Stand Details (indicate cue type and reason): cues for LE position and use of UEs - pt 60% Stand to Sit: To bed;With upper extremity assist;1: +2 Total assist Stand to Sit Details: cues for LE position and use of UEs - pt 60% Ambulation/Gait Ambulation/Gait Assistance: 3: Mod assist Ambulation/Gait Assistance Details (indicate cue type and reason): cues for posture, sequence, and position from RW. - pt 70% with assist required to advance R LE Ambulation Distance (Feet): 16 Feet Assistive device: Rolling walker Gait Pattern: Step-to pattern Gait velocity: slow    Exercise    End of Session PT - End of Session Activity Tolerance: Patient limited by pain Patient left: in bed;with call bell in  reach;with family/visitor present Nurse Communication: Mobility status for transfers;Mobility status for ambulation General Behavior During Session: [facility] for tasks performed Cognition: [facility] for tasks performed  Iyana Topor 05/31/2011, 4:54 PM

## 2011-05-31 NOTE — Progress Notes (Signed)
Physical Therapy Treatment Patient Details Name: Jerome Thomas MRN: [id number] DOB: [birth year] Today's Date: 05/31/2011  PT Assessment/Plan  PT - Assessment/Plan PT Plan: Discharge plan remains appropriate PT Frequency: 7X/week Follow Up Recommendations: Home health PT Equipment Recommended: None recommended by PT PT Goals  Acute Rehab PT Goals PT Goal Formulation: With patient Time For Goal Achievement: 7 days Pt will go Supine/Side to Sit: with supervision PT Goal: Supine/Side to Sit - Progress: Not met Pt will go Sit to Supine/Side: with supervision PT Goal: Sit to Supine/Side - Progress: Not met Pt will go Sit to Stand: with supervision PT Goal: Sit to Stand - Progress: Progressing toward goal Pt will go Stand to Sit: with supervision PT Goal: Stand to Sit - Progress: Progressing toward goal Pt will Ambulate: 51 - 150 feet;with supervision;with rolling walker PT Goal: Ambulate - Progress: Progressing toward goal Pt will Go Up / Down Stairs: 1-2 stairs;with min assist;with least restrictive assistive device PT Goal: Up/Down Stairs - Progress: Not met  PT Treatment Precautions/Restrictions  Restrictions Weight Bearing Restrictions: No Other Position/Activity Restrictions: WBAT Mobility (including Balance) Transfers Transfers: Yes Sit to Stand: 1: +2 Total assist;With upper extremity assist;With armrests;From chair/3-in-1 Sit to Stand Details (indicate cue type and reason): cues for LE position and use of UEs - pt 60% Stand to Sit: 1: +2 Total assist;With armrests;To chair/3-in-1;With upper extremity assist Stand to Sit Details: cues for LE position and use of UEs - pt 60% Ambulation/Gait Ambulation/Gait: Yes Ambulation/Gait Assistance: 1: +2 Total assist Ambulation/Gait Assistance Details (indicate cue type and reason): cues for posture, sequence, and position from RW. - pt 70% with assist required to advance R LE Ambulation Distance (Feet): 3 Feet Assistive device:  Rolling walker Gait Pattern: Step-to pattern Gait velocity: slow Stairs: No    End of Session PT - End of Session Activity Tolerance: Patient limited by pain Patient left: in chair;with call bell in reach;with family/visitor present Nurse Communication: Mobility status for transfers;Mobility status for ambulation General Behavior During Session: [name] for tasks performed Cognition: [name] for tasks performed  Shivangi Lutz 05/31/2011, 1:18 PM

## 2011-06-01 LAB — CBC
HCT: 36.3 % — ABNORMAL LOW (ref 39.0–52.0)
MCV: 91.9 fL (ref 78.0–100.0)
RBC: 3.95 MIL/uL — ABNORMAL LOW (ref 4.22–5.81)
WBC: 10.3 10*3/uL (ref 4.0–10.5)

## 2011-06-01 NOTE — Progress Notes (Signed)
Physical Therapy Treatment Patient Details Name: GIANNY SABINO MRN: [id number] DOB: 1966-06-16 Today's Date: 06/01/2011 1017 - 1052; GT, TE PT Assessment/Plan  PT - Assessment/Plan PT Plan: Discharge plan remains appropriate PT Frequency: 7X/week Follow Up Recommendations: Home health PT Equipment Recommended: None recommended by PT PT Goals  Acute Rehab PT Goals Time For Goal Achievement: 7 days Pt will go Supine/Side to Sit: with supervision PT Goal: Supine/Side to Sit - Progress: Progressing toward goal Pt will go Sit to Stand: with supervision PT Goal: Sit to Stand - Progress: Progressing toward goal Pt will go Stand to Sit: with supervision PT Goal: Stand to Sit - Progress: Progressing toward goal Pt will Ambulate: 51 - 150 feet;with supervision;with rolling walker PT Goal: Ambulate - Progress: Progressing toward goal  PT Treatment Precautions/Restrictions  Restrictions Weight Bearing Restrictions: No Other Position/Activity Restrictions: WBAT Mobility (including Balance) Bed Mobility Supine to Sit: 1: +2 Total assist;With rails Supine to Sit Details (indicate cue type and reason): Pt 60% with increased time and cues for technique/sequence Transfers Sit to Stand: 1: +2 Total assist;From bed;With upper extremity assist Sit to Stand Details (indicate cue type and reason): cues for use of UEs and for LE position  - pt 70% Stand to Sit: 1: +2 Total assist;With armrests;With upper extremity assist;To chair/3-in-1 Stand to Sit Details: cues for use of UEs and for LE position  - pt 70% Ambulation/Gait Ambulation/Gait Assistance: 3: Mod assist Ambulation/Gait Assistance Details (indicate cue type and reason): cues for use of UEs and for LE position  - pt 70% Ambulation Distance (Feet): 19 Feet Assistive device: Rolling walker Gait Pattern: Step-to pattern Gait velocity: slow    Exercise  Total Joint Exercises Ankle Circles/Pumps: AROM;Both;20 reps;Supine Short Arc  Quad: AAROM;Right;10 reps;Supine Heel Slides: AAROM;15 reps;Supine;Right Hip ABduction/ADduction: 15 reps;AAROM;Right;Supine End of Session PT - End of Session Activity Tolerance: Patient limited by pain;Patient limited by fatigue Patient left: in chair;with call bell in reach;with family/visitor present General Behavior During Session: [facility] for tasks performed Cognition: [facility] for tasks performed  Marshell Dilauro 06/01/2011, 11:53 AM

## 2011-06-01 NOTE — Progress Notes (Signed)
Subjective: 2 Days Post-Op Procedure(s) (LRB): TOTAL HIP ARTHROPLASTY ANTERIOR APPROACH (Right) Patient reports pain as moderate.    Objective: Vital signs in last 24 hours: Temp:  [97.8 F (36.6 C)-99.4 F (37.4 C)] 98.4 F (36.9 C) (12/16 0639) Pulse Rate:  [79-94] 79  (12/16 0639) Resp:  [18] 18  (12/16 0639) BP: (130-161)/(76-84) 130/76 mmHg (12/16 0639) SpO2:  [95 %-100 %] 100 % (12/16 1153)  Intake/Output from previous day: 12/15 0701 - 12/16 0700 In: 800 [P.O.:800] Out: 2550 [Urine:2550] Intake/Output this shift:     Basename 06/01/11 0534 05/31/11 0430  HGB 11.9* 12.9*    Basename 06/01/11 0534 05/31/11 0430  WBC 10.3 10.2  RBC 3.95* 4.45  HCT 36.3* 40.7  PLT 195 230    Basename 05/31/11 0430  NA 134*  K 4.2  CL 98  CO2 31  BUN 8  CREATININE 0.96  GLUCOSE 130*  CALCIUM 8.5   No results found for this basename: LABPT:2,INR:2 in the last 72 hours  Incision: dressing C/D/I  Assessment/Plan: 2 Days Post-Op Procedure(s) (LRB): TOTAL HIP ARTHROPLASTY ANTERIOR APPROACH (Right) Up with therapy  Fredrica Capano Y 06/01/2011, 1:06 PM

## 2011-06-01 NOTE — Progress Notes (Signed)
Physical Therapy Treatment Patient Details Name: Jerome Thomas MRN: [id number] DOB: [birth year] Today's Date: 06/01/2011 1342 - 1410; 2GT PT Assessment/Plan  PT - Assessment/Plan PT Plan: Discharge plan remains appropriate PT Frequency: 7X/week Follow Up Recommendations: Home health PT Equipment Recommended: Rolling walker with 5" wheels (pt reports today that he does not actually have RW at home) PT Goals  Acute Rehab PT Goals Time For Goal Achievement: 7 days Pt will go Supine/Side to Sit: with supervision Pt will go Sit to Supine/Side: with supervision PT Goal: Sit to Supine/Side - Progress: Progressing toward goal Pt will go Sit to Stand: with supervision PT Goal: Sit to Stand - Progress: Progressing toward goal Pt will go Stand to Sit: with supervision PT Goal: Stand to Sit - Progress: Progressing toward goal Pt will Ambulate: 51 - 150 feet;with supervision;with rolling walker PT Goal: Ambulate - Progress: Progressing toward goal  PT Treatment Precautions/Restrictions  Restrictions Weight Bearing Restrictions: No Other Position/Activity Restrictions: WBAT Mobility (including Balance) Bed Mobility Sit to Supine - Right: 1: +2 Total assist Sit to Supine - Right Details (indicate cue type and reason): pt 60% Transfers Sit to Stand: 1: +2 Total assist;With armrests;With upper extremity assist;From chair/3-in-1 Sit to Stand Details (indicate cue type and reason): cues for LE position and use of UEs - pt does much better with R Hand on RW and L hand pushing from chair - pt 80% Stand to Sit: 1: +2 Total assist;To bed;With upper extremity assist Stand to Sit Details: cues for Ue use and LE postion - pt 70% Ambulation/Gait Ambulation/Gait Assistance: 3: Mod assist Ambulation/Gait Assistance Details (indicate cue type and reason): cues for sequence, posture and stride length Ambulation Distance (Feet): 24 Feet Assistive device: Rolling walker Gait Pattern: Step-to  pattern Gait velocity: slow    Exercise    End of Session PT - End of Session Activity Tolerance: Patient limited by pain;Patient limited by fatigue Patient left: in bed;with call bell in reach;with family/visitor present General Behavior During Session: [facility] for tasks performed Cognition: [facility] for tasks performed  Jerome Thomas 06/01/2011, 3:10 PM

## 2011-06-02 LAB — CBC
HCT: 35.8 % — ABNORMAL LOW (ref 39.0–52.0)
Hemoglobin: 11.5 g/dL — ABNORMAL LOW (ref 13.0–17.0)
MCH: 29.4 pg (ref 26.0–34.0)
MCHC: 32.1 g/dL (ref 30.0–36.0)

## 2011-06-02 MED ORDER — FLEET ENEMA 7-19 GM/118ML RE ENEM: 1.0000 | ENEMA | Freq: Every day | RECTAL | Status: DC | PRN

## 2011-06-02 MED ORDER — BISACODYL 10 MG RE SUPP: 10.0000 mg | Freq: Every day | RECTAL | Status: DC | PRN

## 2011-06-02 MED ORDER — BISACODYL 5 MG PO TBEC
5.0000 mg | DELAYED_RELEASE_TABLET | Freq: Every day | ORAL | Status: DC | PRN
  Administered 2011-06-02: 5 mg via ORAL
  Filled 2011-06-02: qty 1

## 2011-06-02 NOTE — Progress Notes (Signed)
Patient ID: Jerome Thomas, male   DOB: 02-11-1966, 45 y.o.   MRN: [id number] Doing well. All vitals stable and labs stable. Right hip stable. Anticipate D/C home tomorrow with HHPT/DME.

## 2011-06-02 NOTE — Progress Notes (Signed)
Physical Therapy Treatment Patient Details Name: Jerome Thomas MRN: [id number] DOB: [birth year] Today's Date: 06/02/2011 14:10 - 14:35 1 gt  1 ta PT Assessment/Plan  PT - Assessment/Plan Comments on Treatment Session: Pt also took a muscle relaxor and feels better PT Plan: Discharge plan remains appropriate Follow Up Recommendations: Home health PT Equipment Recommended: 3 in 1 bedside comode PT Goals  Acute Rehab PT Goals PT Goal Formulation: With patient Pt will go Supine/Side to Sit: with supervision PT Goal: Supine/Side to Sit - Progress: Progressing toward goal Pt will go Sit to Supine/Side: with supervision PT Goal: Sit to Supine/Side - Progress: Progressing toward goal Pt will go Sit to Stand: with supervision PT Goal: Sit to Stand - Progress: Progressing toward goal Pt will go Stand to Sit: with supervision PT Goal: Stand to Sit - Progress: Progressing toward goal Pt will Ambulate: 51 - 150 feet;with rolling walker;with supervision PT Goal: Ambulate - Progress: Progressing toward goal Pt will Go Up / Down Stairs: 1-2 stairs;with min assist;with least restrictive assistive device PT Goal: Up/Down Stairs - Progress: Progressing toward goal  PT Treatment Precautions/Restrictions  Precautions Precaution Comments: none.Marland KitchenMarland KitchenMarland KitchenDirect Anterior Restrictions Weight Bearing Restrictions: No Other Position/Activity Restrictions: WBAT Mobility (including Balance) Bed Mobility Bed Mobility: No Sit to Supine - Right Details (indicate cue type and reason): sitting on EOB with OT in the room Transfers Transfers: Yes Sit to Stand: 3: Mod assist Sit to Stand Details (indicate cue type and reason): Mod assist + 2 for safeyt 50% VC's on hand placement Stand to Sit: 3: Mod assist Stand to Sit Details: 50% VC's on hand placement and increased time Ambulation/Gait Ambulation/Gait: Yes Ambulation/Gait Assistance: 4: Min assist Ambulation/Gait Assistance Details (indicate cue type and  reason): 2nd assist following with chair Ambulation Distance (Feet): 55 Feet Assistive device: Rolling walker Gait Pattern: Step-to pattern Gait velocity: instructed pt to advance non operative LE first to decreaase R LR hip pain Stairs: No Wheelchair Mobility Wheelchair Mobility: No    Exercise    End of Session PT - End of Session Equipment Utilized During Treatment: Gait belt Activity Tolerance: Patient tolerated treatment well Patient left: in chair;with call bell in reach;with family/visitor present General Behavior During Session: [facility] for tasks performed Cognition: [facility] for tasks performed  Felecia Shelling PTA [facility]  Acute  Rehab Pager     [phone]

## 2011-06-02 NOTE — Progress Notes (Signed)
Physical Therapy Treatment Patient Details Name: Jerome Thomas MRN: [id number] DOB: Oct 31, 1965 Today's Date: 06/02/2011 13;10 - 13:30 1 gt PT Assessment/Plan  PT - Assessment/Plan Comments on Treatment Session: Pt plans to D/C to home with spouse.  Pt likes his new beri walker much better.  Thank You. PT Plan: Discharge plan remains appropriate Follow Up Recommendations: Home health PT Equipment Recommended: 3 in 1 bedside comode PT Goals  Acute Rehab PT Goals PT Goal Formulation: With patient Pt will go Supine/Side to Sit: with supervision PT Goal: Supine/Side to Sit - Progress: Progressing toward goal Pt will go Sit to Supine/Side: with supervision PT Goal: Sit to Supine/Side - Progress: Progressing toward goal Pt will go Sit to Stand: with supervision PT Goal: Sit to Stand - Progress: Progressing toward goal Pt will go Stand to Sit: with supervision PT Goal: Stand to Sit - Progress: Progressing toward goal Pt will Ambulate: 51 - 150 feet;with rolling walker PT Goal: Ambulate - Progress: Progressing toward goal Pt will Go Up / Down Stairs: 1-2 stairs;with min assist;with least restrictive assistive device PT Goal: Up/Down Stairs - Progress: Progressing toward goal  PT Treatment Precautions/Restrictions  Precautions Precaution Comments: none.Marland KitchenMarland KitchenMarland KitchenDirect Anterior Restrictions Weight Bearing Restrictions: No Other Position/Activity Restrictions: WBAT Mobility (including Balance) Bed Mobility Bed Mobility: No Sit to Supine - Right Details (indicate cue type and reason): sitting on EOB with OT in the room Transfers Transfers: Yes Sit to Stand: 3: Mod assist Sit to Stand Details (indicate cue type and reason): Mod assist + 2 for safeyt 50% VC's on hand placement Stand to Sit: 3: Mod assist Stand to Sit Details: 50% VC's on hand placement and increased time Ambulation/Gait Ambulation/Gait: Yes Ambulation/Gait Assistance: 3: Mod assist Ambulation/Gait Assistance Details  (indicate cue type and reason): 50% VC's to advance non operative LE first to decrease pain @ R ight frontal hip area Ambulation Distance (Feet): 25 Feet Assistive device: Rolling walker Gait Pattern: Step-through pattern Gait velocity: required increased time and chair to follow for safety Stairs: No Wheelchair Mobility Wheelchair Mobility: No    Exercise    End of Session PT - End of Session Equipment Utilized During Treatment: Gait belt Activity Tolerance: Patient tolerated treatment well Patient left: in chair General Behavior During Session: [facility] for tasks performed Cognition: [facility] for tasks performed  Felecia Shelling PTA [facility]  Acute  Rehab Pager     [phone]

## 2011-06-02 NOTE — Progress Notes (Signed)
Occupational Therapy Evaluation Patient Details Name: Jerome Thomas MRN: [id number] DOB: [birth year] Today's Date: 06/02/2011  Problem List:  Patient Active Problem List  Diagnoses  . Avascular necrosis of right femoral head    Past Medical History:  Past Medical History  Diagnosis Date  . Avascular necrosis     right hip  . No pertinent past medical history    Past Surgical History:  Past Surgical History  Procedure Date  . Arthroscopy knee w/ drilling 1980's    left knee  . Hernia repair yrs ago    x 2    OT Assessment/Plan/Recommendation OT Assessment Clinical Impression Statement: Pt presents to OT to increase I with ADL activity and decrease burden on wife OT Recommendation/Assessment: Patient will need skilled OT in the acute care venue OT Problem List: Decreased strength;Pain OT Therapy Diagnosis : Generalized weakness;Acute pain OT Plan OT Frequency: Min 2X/week OT Treatment/Interventions: Self-care/ADL training;DME and/or AE instruction;Patient/family education OT Recommendation Follow Up Recommendations: Home health OT Equipment Recommended: 3 in 1 bedside comode Individuals Consulted Consulted and Agree with Results and Recommendations: Patient;Family member/caregiver OT Goals Acute Rehab OT Goals OT Goal Formulation: With patient Time For Goal Achievement: 2 weeks ADL Goals Pt Will Perform Lower Body Dressing: with min assist;Sit to stand from chair ADL Goal: Lower Body Dressing - Progress: Progressing toward goals Pt Will Transfer to Toilet: with supervision;Comfort height toilet ADL Goal: Toilet Transfer - Progress: Met  OT Evaluation Precautions/Restrictions  Restrictions Weight Bearing Restrictions: No Other Position/Activity Restrictions: WBAT Prior Functioning Home Living Bathroom Shower/Tub: Tub/shower unit;Walk-in shower Bathroom Toilet: Standard Prior Function Level of Independence: Independent with basic ADLs ADL ADL Grooming:  Simulated;Supervision/safety Where Assessed - Grooming: Sitting, bed;Unsupported Upper Body Bathing: Performed;Set up Where Assessed - Upper Body Bathing: Sitting, bed;Unsupported Lower Body Bathing: Simulated;Maximal assistance Where Assessed - Lower Body Bathing: Sit to stand from bed Upper Body Dressing: Performed;Minimal assistance Where Assessed - Upper Body Dressing: Sitting, bed;Unsupported Lower Body Dressing: Maximal assistance Where Assessed - Lower Body Dressing: Sit to stand from bed Toilet Transfer: Not assessed Tub/Shower Transfer: Not assessed    Extremity Assessment RUE Assessment RUE Assessment: Within Functional Limits LUE Assessment LUE Assessment: Within Functional Limits    End of Session OT - End of Session Equipment Utilized During Treatment: Gait belt Activity Tolerance: Patient tolerated treatment well Patient left:  (with PT) General Behavior During Session: [facility] for tasks performed   Kirt Boys 06/02/2011, 1:50 PM

## 2011-06-03 ENCOUNTER — Encounter (HOSPITAL_COMMUNITY): Payer: Self-pay | Admitting: Orthopaedic Surgery

## 2011-06-03 MED ORDER — RIVAROXABAN 10 MG PO TABS: 10.0000 mg | ORAL_TABLET | Freq: Every day | ORAL | Status: DC

## 2011-06-03 MED ORDER — OXYCODONE HCL 5 MG PO TABS: 5.0000 mg | ORAL_TABLET | ORAL | Status: AC | PRN

## 2011-06-03 MED ORDER — METHOCARBAMOL 500 MG PO TABS: 500.0000 mg | ORAL_TABLET | Freq: Four times a day (QID) | ORAL | Status: AC | PRN

## 2011-06-03 NOTE — Discharge Summary (Signed)
Physician Discharge Summary  Patient ID: Jerome Thomas MRN: [id number] DOB/AGE: 1965/10/24 45 y.o.  Admit date: 05/30/2011 Discharge date: 06/03/2011  Admission Diagnoses:  Avascular necrosis of right femoral head  Discharge Diagnoses:  Principal Problem:  *Avascular necrosis of right femoral head   Past Medical History  Diagnosis Date  . Avascular necrosis     right hip  . No pertinent past medical history     Surgeries: Procedure(s): TOTAL HIP ARTHROPLASTY ANTERIOR APPROACH on 05/30/2011   Consultants (if any):    Discharged Condition: Improved  Hospital Course: Jerome Thomas is an 45 y.o. male who was admitted 05/30/2011 with a diagnosis of Avascular necrosis of right femoral head and went to the operating room on 05/30/2011 and underwent the above named procedures.    He was given perioperative antibiotics:  Anti-infectives     Start     Dose/Rate Route Frequency Ordered Stop   05/30/11 1100   ceFAZolin (ANCEF) IVPB 1 g/50 mL premix        1 g 100 mL/hr over 30 Minutes Intravenous Every 6 hours 05/30/11 0952 05/31/11 0129   05/30/11 0615   ceFAZolin (ANCEF) IVPB 2 g/50 mL premix        2 g 100 mL/hr over 30 Minutes Intravenous 60 min pre-op 05/30/11 0609 05/30/11 0725        .  He was given sequential compression devices, early ambulation, and chemoprophylaxis for DVT prophylaxis.  He benefited maximally from their hospital stay and there were no complications.    Recent vital signs:  Filed Vitals:   06/03/11 0601  BP: 125/69  Pulse: 89  Temp: 98 F (36.7 C)  Resp: 18    Recent laboratory studies:  Lab Results  Component Value Date   HGB 11.5* 06/02/2011   HGB 11.9* 06/01/2011   HGB 12.9* 05/31/2011   Lab Results  Component Value Date   WBC 9.1 06/02/2011   PLT 220 06/02/2011   Lab Results  Component Value Date   INR 0.93 05/27/2011   Lab Results  Component Value Date   NA 134* 05/31/2011   K 4.2 05/31/2011   CL 98  05/31/2011   CO2 31 05/31/2011   BUN 8 05/31/2011   CREATININE 0.96 05/31/2011   GLUCOSE 130* 05/31/2011    Discharge Medications:   Current Discharge Medication List    START taking these medications   Details  methocarbamol (ROBAXIN) 500 MG tablet Take 1 tablet (500 mg total) by mouth every 6 (six) hours as needed. Qty: 60 tablet, Refills: 0    oxyCODONE (OXY IR/ROXICODONE) 5 MG immediate release tablet Take 1-2 tablets (5-10 mg total) by mouth every 4 (four) hours as needed. Qty: 60 tablet, Refills: 0    rivaroxaban (XARELTO) 10 MG TABS tablet Take 1 tablet (10 mg total) by mouth daily with breakfast. Qty: 20 tablet, Refills: 0      STOP taking these medications     HYDROcodone-acetaminophen (NORCO) 5-325 MG per tablet         Diagnostic Studies: Dg Chest 2 View  05/27/2011  *RADIOLOGY REPORT*  Clinical Data: Preop total hip arthroplasty  CHEST - 2 VIEW  Comparison: Chest radiograph 08/05/2004  Findings: Normal heart size.  Mediastinal and hilar contours are stable.  Trachea is midline.  The lungs are clear.  Negative for pleural effusion or pneumothorax.  No acute bony abnormality.  IMPRESSION: Stable chest.  No acute findings.  Original Report Authenticated By: Britta Mccreedy, M.D.  Dg Hip Complete Right  05/30/2011  *RADIOLOGY REPORT*  Clinical Data: Right total hip replacement  RIGHT HIP - COMPLETE 2+ VIEW  Comparison: Correlation with lateral film performed today  Findings: A right total hip prosthesis is in place and anatomically aligned.  There is no evidence of fracture.  No peri hardware lucency.  IMPRESSION: Right total hip prosthesis is anatomically aligned.  No evidence of complication.  Original Report Authenticated By: Brandon Melnick, M.D.   Dg Pelvis Portable  05/30/2011  *RADIOLOGY REPORT*  Clinical Data: Hip replacement.  PORTABLE PELVIS  Comparison: None.  Findings: Right total hip arthroplasty is in place.  The device is located.  No fracture is identified.   Mild appearing left hip degenerative disease is noted.  IMPRESSION: Right total hip without evidence of complication.  Original Report Authenticated By: Bernadene Bell. D'ALESSIO, M.D.   Dg Hip Portable 1 View Right  05/30/2011  *RADIOLOGY REPORT*  Clinical Data: Hip replacement.  PORTABLE RIGHT HIP - 1 VIEW  Comparison: None.  Findings: New right total hip arthroplasty is in place.  The device is located.  No fracture is identified.  Surgical staples noted.  IMPRESSION: Right total hip without evidence of complication.  Original Report Authenticated By: Bernadene Bell. D'ALESSIO, M.D.   Dg C-arm 61-120 Min-no Report  05/30/2011  CLINICAL DATA: intra op   C-ARM 61-120 MINUTES  Fluoroscopy was utilized by the requesting physician.  No radiographic  interpretation.      Disposition: to home       Signed: Kathryne Hitch 06/03/2011, 6:40 AM

## 2011-06-03 NOTE — Progress Notes (Signed)
Physical Therapy Treatment Patient Details Name: Jerome Thomas MRN: [id number] DOB: 1966-01-15 Today's Date: 06/03/2011 9:15 - 9:55 2 te  1 ta PT Assessment/Plan  PT - Assessment/Plan Comments on Treatment Session: Pt feels much better and plans to D/C to home today PT Plan: Discharge plan remains appropriate Follow Up Recommendations: Home health PT PT Goals   see PT eval  PT Treatment Precautions/Restrictions  Precautions Precaution Comments: none.Marland KitchenMarland KitchenMarland KitchenDirect Anterior Restrictions Weight Bearing Restrictions: No Other Position/Activity Restrictions: WBAT Mobility (including Balance) Bed Mobility Sit to Supine - Right Details (indicate cue type and reason): instructed pt how to use a leg lifter to assist R LE up in bed plus Pt required increased time Transfers Transfers: Yes Sit to Stand: 5: Supervision;From chair/3-in-1 Sit to Stand Details (indicate cue type and reason): One VC on trun completion prior to sit Stand to Sit: 5: Supervision;To bed Stand to Sit Details: One VC on hand placement and controlled decline Ambulation/Gait Ambulation/Gait: No Ambulation/Gait Assistance Details (indicate cue type and reason): focus this session was HEP    Exercise  Total Joint Exercises Ankle Circles/Pumps: AROM;Both;10 reps Quad Sets: AROM;Both;10 reps Gluteal Sets: AROM;Both;10 reps Towel Squeeze: AROM;Both;10 reps Short Arc Quad: AROM;10 reps;Right Heel Slides: AROM;10 reps;Right Hip ABduction/ADduction: AROM;10 reps;Right Long Arc Quad: AROM;10 reps;Right Knee Flexion: AAROM;Right;10 reps Marching in Standing: AAROM;Right;10 reps Standing Hip Extension: Right;10 reps End of Session PT - End of Session Equipment Utilized During Treatment: Gait belt Activity Tolerance: Patient tolerated treatment well Patient left: in bed General Behavior During Session: [facility] for tasks performed Cognition: [facility] for tasks performed Felecia Shelling PTA [facility]  Acute  Rehab Pager      [phone]

## 2011-06-03 NOTE — Progress Notes (Signed)
Discharge summary sent to payer through [facility]  

## 2012-08-04 ENCOUNTER — Inpatient Hospital Stay (HOSPITAL_COMMUNITY)
Admission: EM | Admit: 2012-08-04 | Discharge: 2012-08-07 | DRG: 090 | Disposition: A | Discharge status: Home / Self Care | Payer: BC Managed Care – PPO | Attending: Internal Medicine | Admitting: Internal Medicine

## 2012-08-04 ENCOUNTER — Emergency Department (HOSPITAL_COMMUNITY): Payer: BC Managed Care – PPO

## 2012-08-04 ENCOUNTER — Encounter (HOSPITAL_COMMUNITY): Payer: Self-pay | Admitting: Emergency Medicine

## 2012-08-04 DIAGNOSIS — D72829 Elevated white blood cell count, unspecified: Secondary | ICD-10-CM | POA: Diagnosis not present

## 2012-08-04 DIAGNOSIS — J111 Influenza due to unidentified influenza virus with other respiratory manifestations: Secondary | ICD-10-CM

## 2012-08-04 DIAGNOSIS — Z79899 Other long term (current) drug therapy: Secondary | ICD-10-CM

## 2012-08-04 DIAGNOSIS — R0602 Shortness of breath: Secondary | ICD-10-CM

## 2012-08-04 DIAGNOSIS — B349 Viral infection, unspecified: Secondary | ICD-10-CM

## 2012-08-04 DIAGNOSIS — R0902 Hypoxemia: Secondary | ICD-10-CM

## 2012-08-04 DIAGNOSIS — R000 Tachycardia, unspecified: Secondary | ICD-10-CM

## 2012-08-04 DIAGNOSIS — Z96649 Presence of unspecified artificial hip joint: Secondary | ICD-10-CM

## 2012-08-04 DIAGNOSIS — J189 Pneumonia, unspecified organism: Principal | ICD-10-CM

## 2012-08-04 DIAGNOSIS — R062 Wheezing: Secondary | ICD-10-CM

## 2012-08-04 DIAGNOSIS — Z792 Long term (current) use of antibiotics: Secondary | ICD-10-CM

## 2012-08-04 DIAGNOSIS — J180 Bronchopneumonia, unspecified organism: Secondary | ICD-10-CM | POA: Diagnosis present

## 2012-08-04 DIAGNOSIS — J209 Acute bronchitis, unspecified: Secondary | ICD-10-CM | POA: Diagnosis present

## 2012-08-04 DIAGNOSIS — F17200 Nicotine dependence, unspecified, uncomplicated: Secondary | ICD-10-CM | POA: Diagnosis present

## 2012-08-04 DIAGNOSIS — T380X5A Adverse effect of glucocorticoids and synthetic analogues, initial encounter: Secondary | ICD-10-CM | POA: Diagnosis not present

## 2012-08-04 LAB — CBC WITH DIFFERENTIAL/PLATELET
HCT: 50.9 % (ref 39.0–52.0)
Hemoglobin: 17.3 g/dL — ABNORMAL HIGH (ref 13.0–17.0)
Lymphocytes Relative: 15 % (ref 12–46)
Lymphs Abs: 1.6 10*3/uL (ref 0.7–4.0)
Monocytes Absolute: 1 10*3/uL (ref 0.1–1.0)
Monocytes Relative: 9 % (ref 3–12)
Neutro Abs: 8 10*3/uL — ABNORMAL HIGH (ref 1.7–7.7)
RBC: 5.81 MIL/uL (ref 4.22–5.81)
WBC: 10.7 10*3/uL — ABNORMAL HIGH (ref 4.0–10.5)

## 2012-08-04 LAB — URINALYSIS, ROUTINE W REFLEX MICROSCOPIC
Protein, ur: 30 mg/dL — AB
Urobilinogen, UA: 1 mg/dL (ref 0.0–1.0)

## 2012-08-04 LAB — COMPREHENSIVE METABOLIC PANEL
AST: 20 U/L (ref 0–37)
Alkaline Phosphatase: 101 U/L (ref 39–117)
BUN: 14 mg/dL (ref 6–23)
CO2: 26 mEq/L (ref 19–32)
Chloride: 94 mEq/L — ABNORMAL LOW (ref 96–112)
Creatinine, Ser: 1.05 mg/dL (ref 0.50–1.35)
GFR calc non Af Amer: 83 mL/min — ABNORMAL LOW (ref >90)
Total Bilirubin: 0.8 mg/dL (ref 0.3–1.2)

## 2012-08-04 LAB — URINE MICROSCOPIC-ADD ON

## 2012-08-04 MED ORDER — ACETAMINOPHEN 325 MG PO TABS: 650.0000 mg | ORAL_TABLET | Freq: Four times a day (QID) | ORAL | Status: DC | PRN

## 2012-08-04 MED ORDER — IPRATROPIUM BROMIDE 0.02 % IN SOLN
0.5000 mg | Freq: Once | RESPIRATORY_TRACT | Status: AC
  Administered 2012-08-04: 0.5 mg via RESPIRATORY_TRACT
  Filled 2012-08-04: qty 2.5

## 2012-08-04 MED ORDER — ALBUTEROL SULFATE (5 MG/ML) 0.5% IN NEBU
2.5000 mg | INHALATION_SOLUTION | Freq: Three times a day (TID) | RESPIRATORY_TRACT | Status: DC
  Administered 2012-08-05 – 2012-08-07 (×7): 2.5 mg via RESPIRATORY_TRACT
  Filled 2012-08-04 (×5): qty 0.5

## 2012-08-04 MED ORDER — ALBUTEROL SULFATE (5 MG/ML) 0.5% IN NEBU: 2.5000 mg | INHALATION_SOLUTION | RESPIRATORY_TRACT | Status: DC | PRN

## 2012-08-04 MED ORDER — METHYLPREDNISOLONE SODIUM SUCC 125 MG IJ SOLR
60.0000 mg | INTRAMUSCULAR | Status: DC
  Administered 2012-08-04 – 2012-08-05 (×3): 60 mg via INTRAVENOUS
  Filled 2012-08-04 (×9): qty 0.96
  Filled 2012-08-04: qty 2

## 2012-08-04 MED ORDER — ONDANSETRON HCL 4 MG PO TABS: 4.0000 mg | ORAL_TABLET | Freq: Four times a day (QID) | ORAL | Status: DC | PRN

## 2012-08-04 MED ORDER — SODIUM CHLORIDE 0.9 % IV BOLUS (SEPSIS)
1000.0000 mL | Freq: Once | INTRAVENOUS | Status: AC
  Administered 2012-08-04: 1000 mL via INTRAVENOUS

## 2012-08-04 MED ORDER — HYDROCODONE-ACETAMINOPHEN 5-325 MG PO TABS
1.0000 | ORAL_TABLET | ORAL | Status: DC | PRN
  Administered 2012-08-06: 2 via ORAL
  Filled 2012-08-04: qty 2

## 2012-08-04 MED ORDER — ONDANSETRON HCL 4 MG/2ML IJ SOLN: 4.0000 mg | Freq: Four times a day (QID) | INTRAMUSCULAR | Status: DC | PRN

## 2012-08-04 MED ORDER — DOCUSATE SODIUM 100 MG PO CAPS
100.0000 mg | ORAL_CAPSULE | Freq: Two times a day (BID) | ORAL | Status: DC
  Administered 2012-08-04 – 2012-08-07 (×5): 100 mg via ORAL
  Filled 2012-08-04 (×7): qty 1

## 2012-08-04 MED ORDER — ALBUTEROL SULFATE (5 MG/ML) 0.5% IN NEBU
2.5000 mg | INHALATION_SOLUTION | Freq: Four times a day (QID) | RESPIRATORY_TRACT | Status: DC
  Administered 2012-08-04: 2.5 mg via RESPIRATORY_TRACT
  Filled 2012-08-04: qty 0.5

## 2012-08-04 MED ORDER — LEVOFLOXACIN IN D5W 500 MG/100ML IV SOLN
500.0000 mg | INTRAVENOUS | Status: DC
  Administered 2012-08-04 – 2012-08-06 (×3): 500 mg via INTRAVENOUS
  Filled 2012-08-04 (×3): qty 100

## 2012-08-04 MED ORDER — ALBUTEROL SULFATE (5 MG/ML) 0.5% IN NEBU: 2.5000 mg | INHALATION_SOLUTION | Freq: Four times a day (QID) | RESPIRATORY_TRACT | Status: DC | PRN

## 2012-08-04 MED ORDER — ALBUTEROL (5 MG/ML) CONTINUOUS INHALATION SOLN
10.0000 mg/h | INHALATION_SOLUTION | RESPIRATORY_TRACT | Status: DC
  Administered 2012-08-04: 10 mg/h via RESPIRATORY_TRACT

## 2012-08-04 MED ORDER — ACETAMINOPHEN 650 MG RE SUPP: 650.0000 mg | Freq: Four times a day (QID) | RECTAL | Status: DC | PRN

## 2012-08-04 MED ORDER — ACETAMINOPHEN 325 MG PO TABS
650.0000 mg | ORAL_TABLET | Freq: Four times a day (QID) | ORAL | Status: DC | PRN
  Administered 2012-08-04: 650 mg via ORAL
  Filled 2012-08-04: qty 2

## 2012-08-04 MED ORDER — SODIUM CHLORIDE 0.9 % IV SOLN
INTRAVENOUS | Status: DC
  Administered 2012-08-04: via INTRAVENOUS
  Administered 2012-08-05 – 2012-08-06 (×2): 1000 mL via INTRAVENOUS

## 2012-08-04 MED ORDER — OSELTAMIVIR PHOSPHATE 75 MG PO CAPS
75.0000 mg | ORAL_CAPSULE | Freq: Two times a day (BID) | ORAL | Status: DC
  Administered 2012-08-04 – 2012-08-06 (×5): 75 mg via ORAL
  Filled 2012-08-04 (×7): qty 1

## 2012-08-04 MED ORDER — ALBUTEROL SULFATE (5 MG/ML) 0.5% IN NEBU
5.0000 mg | INHALATION_SOLUTION | Freq: Once | RESPIRATORY_TRACT | Status: AC
Start: 2012-08-04 — End: 2012-08-04
  Administered 2012-08-04: 5 mg via RESPIRATORY_TRACT
  Filled 2012-08-04: qty 1

## 2012-08-04 MED ORDER — ALBUTEROL SULFATE (5 MG/ML) 0.5% IN NEBU
INHALATION_SOLUTION | RESPIRATORY_TRACT | Status: AC
  Administered 2012-08-04: 10 mg/h via RESPIRATORY_TRACT
  Filled 2012-08-04: qty 2

## 2012-08-04 MED ORDER — METHYLPREDNISOLONE SODIUM SUCC 125 MG IJ SOLR
125.0000 mg | Freq: Once | INTRAMUSCULAR | Status: AC
Start: 2012-08-04 — End: 2012-08-04
  Administered 2012-08-04: 125 mg via INTRAVENOUS
  Filled 2012-08-04: qty 2

## 2012-08-04 NOTE — ED Notes (Addendum)
Pt presenting to ed with c/o flu like symptoms x 3 days. Pt states non-productive cough. Pt states positive nausea and vomiting. Pt states he had diarrhea today. Pt states his entire body is aching. Pt states he went to [facility] on Monday and they told him if he didn't feel any better to present to the ED. Pt states decreased appetite and pt states his chest pain hurts with cough only. Pt states he went to his pcp today and was told to present to er to r/o pneumonia

## 2012-08-04 NOTE — ED Notes (Signed)
RN unable to take report.  To return call.   

## 2012-08-04 NOTE — H&P (Signed)
[facility] History and Physical  Jerome Thomas YQM:[id number] DOB: [birth year]    PCP:   [facility].  Chief Complaint: cough, myalgia, and not getting better.  HPI: Jerome Thomas is an 47 y.o. male with benign past medical hx on no chronic meds, presents to ER with persistent cough, myalgia, subjective fever, and shortness of breath with wheezing.  He was seen in an urgent care about 4 days ago, and was placed with Zpak along with Tamiflu and ventolin Inhaler.  Evaluation in the ER included unremarkable serology, negative CXR with central bronchitic changes, and slightly low Na with Hb of 17g/DL.  He does smoke cigarettes of about 1.5 ppd for many years.  He drinks only on the weekend.  In the ER, he was given several nebs along with IV steroids, and hospitalist was asked to admit him for further tx of viral syndrome with significant wheezing.  Rewiew of Systems:  Constitutional:  No significant weight loss or weight gain Eyes: Negative for eye pain, redness and discharge, diplopia, visual changes, or flashes of light. ENMT: Negative for ear pain, hoarseness, nasal congestion, sinus pressure and sore throat. No headaches; tinnitus, drooling, or problem swallowing. Cardiovascular: Negative for chest pain, palpitations, diaphoresis,  and peripheral edema. ; No orthopnea, PND Respiratory: Negative for  Hemoptysis and stridor. No pleuritic chestpain. Gastrointestinal: Negative for nausea, vomiting, diarrhea, constipation, abdominal pain, melena, blood in stool, hematemesis, jaundice and rectal bleeding.    Genitourinary: Negative for frequency, dysuria, incontinence,flank pain and hematuria; Musculoskeletal: Negative for back pain and neck pain. Negative for swelling and trauma.;  Skin: . Negative for pruritus, rash, abrasions, bruising and skin lesion.; ulcerations Neuro: Negative for headache, lightheadedness and neck stiffness. Negative for weakness, altered level of  consciousness , altered mental status, extremity weakness, burning feet, involuntary movement, seizure and syncope.  Psych: negative for anxiety, depression, insomnia, tearfulness, panic attacks, hallucinations, paranoia, suicidal or homicidal ideation    Past Medical History  Diagnosis Date  . Avascular necrosis     right hip  . No pertinent past medical history     Past Surgical History  Procedure Laterality Date  . Arthroscopy knee w/ drilling  1980's    left knee  . Hernia repair  yrs ago    x 2  . Total hip arthroplasty  05/30/2011    Procedure: TOTAL HIP ARTHROPLASTY ANTERIOR APPROACH;  Surgeon: Kathryne Hitch;  Location: [facility];  Service: Orthopedics;  Laterality: Right;  Right Total Hip Arthroplasty, Direct Anterior Approach (C-Arm)    Medications:  HOME MEDS: Prior to Admission medications   Medication Sig Start Date End Date Taking? Authorizing Provider  albuterol (PROVENTIL HFA;VENTOLIN HFA) 108 (90 BASE) MCG/ACT inhaler Inhale 2 puffs into the lungs every 6 (six) hours as needed (SHORTNESS OF BREATH).   Yes Historical Provider, MD  azithromycin (ZITHROMAX) 500 MG tablet Take 500 mg by mouth daily. 08/02/12 08/07/12 Yes Historical Provider, MD  oseltamivir (TAMIFLU) 75 MG capsule Take 75 mg by mouth 2 (two) times daily. 08/02/12 08/12/12 Yes Historical Provider, MD     Allergies:  No Known Allergies  Social History:   reports that he has been smoking Cigarettes.  He has a 29 pack-year smoking history. He has never used smokeless tobacco. He reports that  drinks alcohol. He reports that he does not use illicit drugs.  Family History: History reviewed. No pertinent family history.   Physical Exam: Filed Vitals:   08/04/12 1459 08/04/12 1656 08/04/12 1702 08/04/12 1732  BP: 120/78  128/76   Pulse: 124     Temp: 101 F (38.3 C)  99.5 F (37.5 C)   TempSrc: Oral  Oral   Resp:   18   SpO2: 91% 93% 97% 93%   Blood pressure 128/76, pulse 124, temperature  99.5 F (37.5 C), temperature source Oral, resp. rate 18, SpO2 93.00%.  GEN:  Pleasant patient lying in the stretcher in no acute distress; cooperative with exam. PSYCH:  alert and oriented x4; does not appear anxious or depressed; affect is appropriate. HEENT: Mucous membranes pink and anicteric; PERRLA; EOM intact; no cervical lymphadenopathy nor thyromegaly or carotid bruit; no JVD; There were no stridor. Neck is very supple. Breasts:: Not examined CHEST WALL: No tenderness CHEST: Normal respiration, tight bilateral wheezing, no rales. HEART: Regular rate and rhythm.  There are no murmur, rub, or gallops.   BACK: No kyphosis or scoliosis; no CVA tenderness ABDOMEN: soft and non-tender; no masses, no organomegaly, normal abdominal bowel sounds; no pannus; no intertriginous candida. There is no rebound and no distention. Rectal Exam: Not done EXTREMITIES: No bone or joint deformity; age-appropriate arthropathy of the hands and knees; no edema; no ulcerations.  There is no calf tenderness. Genitalia: not examined PULSES: 2+ and symmetric SKIN: Normal hydration no rash or ulceration.  He is slightly diaphoretic. CNS: Cranial nerves 2-12 grossly intact no focal lateralizing neurologic deficit.  Speech is fluent; uvula elevated with phonation, facial symmetry and tongue midline. DTR are normal bilaterally, cerebella exam is intact, barbinski is negative and strengths are equaled bilaterally.  No sensory loss.   Labs on Admission:  Basic Metabolic Panel:  Recent Labs Lab 08/04/12 1600  NA 132*  K 4.7  CL 94*  CO2 26  GLUCOSE 109*  BUN 14  CREATININE 1.05  CALCIUM 8.9   Liver Function Tests:  Recent Labs Lab 08/04/12 1600  AST 20  ALT 29  ALKPHOS 101  BILITOT 0.8  PROT 8.0  ALBUMIN 3.4*   No results found for this basename: LIPASE, AMYLASE,  in the last 168 hours No results found for this basename: AMMONIA,  in the last 168 hours CBC:  Recent Labs Lab 08/04/12 1600   WBC 10.7*  NEUTROABS 8.0*  HGB 17.3*  HCT 50.9  MCV 87.6  PLT 167   Cardiac Enzymes: No results found for this basename: CKTOTAL, CKMB, CKMBINDEX, TROPONINI,  in the last 168 hours  CBG: No results found for this basename: GLUCAP,  in the last 168 hours   Radiological Exams on Admission: Dg Chest Port 1 View  (if Code Sepsis Called)  08/04/2012  *RADIOLOGY REPORT*  Clinical Data: Cough, shortness of breath  PORTABLE CHEST - 1 VIEW  Comparison: 05/27/2011  Findings: Cardiomediastinal silhouette is stable. No acute infiltrate or pleural effusion.  No pulmonary edema.  Central mild bronchitic changes.  IMPRESSION: No acute infiltrate or pulmonary edema.  Central mild bronchitic changes.   Original Report Authenticated By: Natasha Mead, M.D.     Assessment/Plan  1/ Viral syndrome. 2/ Suspicious for the Influenza virus. 3/ Wheezing without prior Dx of asthma. 4/ Tobacco use, active.  PLAN:  Will admit him to general medical floor.  Will continue with IVF steroid and IVF fluid.  I will start him on IV Levaquin, and continue with Tamiflu pending confirmatory test.  He will receive frequent nebulizer tx and some pain medication.  He is stable, full code, and will be admitted to [facility] service.  I have of course encouraged  him to quit cigarettes permanently.  Thank you for allowing me to partake in the care of your nice patient.    Other plans as per orders.  Code Status: FULL Unk Lightning, MD. [facility] Pager [phone] 7pm to 7am.  08/04/2012, 9:14 PM

## 2012-08-04 NOTE — ED Provider Notes (Signed)
History     CSN: [id number]  Arrival date & time 08/04/12  1443   First MD Initiated Contact with Patient 08/04/12 1518      Chief Complaint  Patient presents with  . Fever  . flu symptoms    (Consider location/radiation/quality/duration/timing/severity/associated sxs/prior treatment) The history is provided by the patient, medical records and the spouse.    EURAL HOLZSCHUH is a 47 y.o. male  with a hx of avascular necrosis presents to the Emergency Department complaining of gradual, persistent, progressively worsening URI onset 4 days ago.  Pt seen at [facility] 2 days ago and pt was given albuterol inhaler, azithromycin and tamiflu, but has not improved.  . Associated symptoms include cough, congestion, chest pain aggravated by lying flat and coughing, shortness of breath, fever, chills, nausea, vomiting, diarrhea, abdominal soreness with coughing and vomiting, weakness, fatigue.   Pt did not get a flu shot this year.  Hernia repair in 2000; no other abdominal surgery.    Albuterol inhaler makes it better and nothing makes it worse.  Patient denies dysuria, hematuria, syncope.  PCP: [employer], English as a foreign language)  Past Medical History  Diagnosis Date  . Avascular necrosis     right hip  . No pertinent past medical history     Past Surgical History  Procedure Laterality Date  . Arthroscopy knee w/ drilling  1980's    left knee  . Hernia repair  yrs ago    x 2  . Total hip arthroplasty  05/30/2011    Procedure: TOTAL HIP ARTHROPLASTY ANTERIOR APPROACH;  Surgeon: Kathryne Hitch;  Location: [facility];  Service: Orthopedics;  Laterality: Right;  Right Total Hip Arthroplasty, Direct Anterior Approach (C-Arm)    History reviewed. No pertinent family history.  History  Substance Use Topics  . Smoking status: Current Every Day Smoker -- 1.00 packs/day for 29 years    Types: Cigarettes  . Smokeless tobacco: Never Used  . Alcohol Use: Yes     Comment: occasional      Review of  Systems  Constitutional: Positive for fever. Negative for diaphoresis, appetite change, fatigue and unexpected weight change.  HENT: Positive for sore throat. Negative for congestion, rhinorrhea, mouth sores, neck pain, neck stiffness and postnasal drip.   Eyes: Negative for visual disturbance.  Respiratory: Positive for cough, chest tightness, shortness of breath and wheezing.   Cardiovascular: Negative for chest pain.  Gastrointestinal: Positive for nausea and vomiting. Negative for abdominal pain and diarrhea.  Endocrine: Negative for polydipsia, polyphagia and polyuria.  Genitourinary: Negative for dysuria, urgency, frequency and hematuria.  Musculoskeletal: Negative for back pain.  Skin: Negative for rash.  Allergic/Immunologic: Negative for immunocompromised state.  Neurological: Negative for syncope, light-headedness and headaches.  Hematological: Does not bruise/bleed easily.  Psychiatric/Behavioral: Negative for sleep disturbance. The patient is not nervous/anxious.   All other systems reviewed and are negative.    Allergies  Review of patient's allergies indicates no known allergies.  Home Medications   Current Outpatient Rx  Name  Route  Sig  Dispense  Refill  . albuterol (PROVENTIL HFA;VENTOLIN HFA) 108 (90 BASE) MCG/ACT inhaler   Inhalation   Inhale 2 puffs into the lungs every 6 (six) hours as needed (SHORTNESS OF BREATH).         Marland Kitchen azithromycin (ZITHROMAX) 500 MG tablet   Oral   Take 500 mg by mouth daily.         Marland Kitchen oseltamivir (TAMIFLU) 75 MG capsule   Oral   Take  75 mg by mouth 2 (two) times daily.           BP 128/76  Pulse 124  Temp(Src) 99.5 F (37.5 C) (Oral)  Resp 18  SpO2 93%  Physical Exam  Constitutional: He is oriented to person, place, and time. He appears well-developed and well-nourished. No distress.  HENT:  Head: Normocephalic and atraumatic.  Right Ear: Tympanic membrane, external ear and ear canal normal.  Left Ear: Tympanic  membrane, external ear and ear canal normal.  Nose: Mucosal edema and rhinorrhea present. No epistaxis. Right sinus exhibits no maxillary sinus tenderness and no frontal sinus tenderness. Left sinus exhibits no maxillary sinus tenderness and no frontal sinus tenderness.  Mouth/Throat: Uvula is midline, oropharynx is clear and moist and mucous membranes are normal. Mucous membranes are not pale and not cyanotic. No oropharyngeal exudate, posterior oropharyngeal edema, posterior oropharyngeal erythema or tonsillar abscesses.  Eyes: Conjunctivae are normal. Pupils are equal, round, and reactive to light.  Neck: Normal range of motion and full passive range of motion without pain.  Cardiovascular: S1 normal, S2 normal and intact distal pulses.  Tachycardia present.   Pulses:      Radial pulses are 2+ on the right side, and 2+ on the left side.       Dorsalis pedis pulses are 2+ on the right side, and 2+ on the left side.       Posterior tibial pulses are 2+ on the right side, and 2+ on the left side.  Pulmonary/Chest: Effort normal and breath sounds normal. No stridor. No respiratory distress. He has no wheezes. He has no rales. He exhibits no tenderness.  Abdominal: Soft. Bowel sounds are normal. He exhibits no distension and no mass. There is tenderness (mild, generalized). There is no rebound and no guarding.  Musculoskeletal: Normal range of motion. He exhibits no edema and no tenderness.  Lymphadenopathy:    He has no cervical adenopathy.  Neurological: He is alert and oriented to person, place, and time. No cranial nerve deficit. He exhibits normal muscle tone. Coordination normal.  Skin: Skin is warm. No rash noted. He is diaphoretic.  Psychiatric: He has a normal mood and affect.    ED Course  Procedures (including critical care time)  Labs Reviewed  CBC WITH DIFFERENTIAL - Abnormal; Notable for the following:    WBC 10.7 (*)    Hemoglobin 17.3 (*)    Neutro Abs 8.0 (*)    All other  components within normal limits  COMPREHENSIVE METABOLIC PANEL - Abnormal; Notable for the following:    Sodium 132 (*)    Chloride 94 (*)    Glucose, Bld 109 (*)    Albumin 3.4 (*)    GFR calc non Af Amer 83 (*)    All other components within normal limits  D-DIMER, QUANTITATIVE - Abnormal; Notable for the following:    D-Dimer, Quant 0.63 (*)    All other components within normal limits  URINALYSIS, ROUTINE W REFLEX MICROSCOPIC  INFLUENZA PANEL BY PCR   Dg Chest Port 1 View  (if Code Sepsis Called)  08/04/2012  *RADIOLOGY REPORT*  Clinical Data: Cough, shortness of breath  PORTABLE CHEST - 1 VIEW  Comparison: 05/27/2011  Findings: Cardiomediastinal silhouette is stable. No acute infiltrate or pleural effusion.  No pulmonary edema.  Central mild bronchitic changes.  IMPRESSION: No acute infiltrate or pulmonary edema.  Central mild bronchitic changes.   Original Report Authenticated By: Natasha Mead, M.D.      1.  Influenza-like illness   2. Hypoxia   3. Tachycardia   4. Wheezing   5. Shortness of breath       MDM  Anola Gurney presents with flu-like-illness.  Pt with mild hypoxia and tachycardia, will treat and reevaluate.  Pt remains tachycardic to 115 after NS 1L, and mildly hypoxic at 92% after albuterol treatment.  One-hour albuterol ordered.  Agent remains tachycardic and hypoxic after second of your treatment. Will proceed with admission.  D-dimer and influenza pending.  [facility] will admit.        Dahlia Client Myrl Bynum, PA-C 08/04/12 2005

## 2012-08-05 ENCOUNTER — Inpatient Hospital Stay (HOSPITAL_COMMUNITY): Payer: BC Managed Care – PPO

## 2012-08-05 LAB — GLUCOSE, CAPILLARY: Glucose-Capillary: 143 mg/dL — ABNORMAL HIGH (ref 70–99)

## 2012-08-05 LAB — INFLUENZA PANEL BY PCR (TYPE A & B): Influenza A By PCR: NEGATIVE

## 2012-08-05 MED ORDER — METHYLPREDNISOLONE SODIUM SUCC 125 MG IJ SOLR
60.0000 mg | Freq: Four times a day (QID) | INTRAMUSCULAR | Status: DC
  Administered 2012-08-05 – 2012-08-06 (×4): 60 mg via INTRAVENOUS
  Filled 2012-08-05 (×8): qty 0.96

## 2012-08-05 MED ORDER — IOHEXOL 350 MG/ML SOLN
100.0000 mL | Freq: Once | INTRAVENOUS | Status: AC | PRN
  Administered 2012-08-05: 100 mL via INTRAVENOUS

## 2012-08-05 NOTE — Care Management Note (Signed)
CARE MANAGEMENT NOTE 08/05/2012  Patient:  Jerome Thomas, Jerome Thomas   Account Number:  [id number]  Date Initiated:  08/05/2012  Documentation initiated by:  Rafel Garde  Subjective/Objective Assessment:   47 yo male admitted with bronchitis. PTA pt independent.     Action/Plan:   Home when stable   Anticipated DC Date:     Anticipated DC Plan:           Choice offered to / List presented to:             Status of service:   Medicare Important Message given?   (If response is "NO", the following Medicare IM given date fields will be blank) Date Medicare IM given:   Date Additional Medicare IM given:    Discharge Disposition:    Per UR Regulation:  Reviewed for med. necessity/level of care/duration of stay  If discussed at Long Length of Stay Meetings, dates discussed:    Comments:  08/05/12 1256 Dayten Juba,RN,BSN [phone]

## 2012-08-05 NOTE — ED Provider Notes (Signed)
Medical screening examination/treatment/procedure(s) were conducted as a shared visit with non-physician practitioner(s) and myself.  I personally evaluated the patient during the encounter  Toy Baker, MD 08/05/12 1113

## 2012-08-05 NOTE — Progress Notes (Signed)
[facility] PROGRESS NOTE  Jerome Thomas ZOX:[id number] DOB: 28-Feb-1966 DOA: 08/04/2012 PCP: No primary provider on file.  Assessment/Plan: 1. Acute Bronchitis/ viral illness: Will continue with antibiotics, levaquin. Will continue with tamiflu to complete 5 days treatment. Continue with nebulizer. Advised to patient to quit smoking to prevent COPD. Check for HIV.  2. Dyspnea: D dimer elevated. Will order CT angio rule out PE.  3. Tobacco use: Will order cessation counseling.   Code Status: Full Code. Family Communication: care discussed with patient. Disposition Plan: Remain inpatient.   Consultants:  None  Procedures:  None  Antibiotics:  Levaquin 2-19  HPI/Subjective: Feeling better. Relates chest pain associated with cough.   Objective: Filed Vitals:   08/04/12 2153 08/04/12 2241 08/05/12 0609 08/05/12 0906  BP: 133/78  142/83   Pulse: 104  87   Temp: 98.7 F (37.1 C)  97.3 F (36.3 C)   TempSrc: Oral  Oral   Resp: 18  18   Height: 6\' 4"  (1.93 m)     Weight: 136.76 kg (301 lb 8 oz)     SpO2: 94% 90% 94% 92%    Intake/Output Summary (Last 24 hours) at 08/05/12 1033 Last data filed at 08/05/12 0753  Gross per 24 hour  Intake    360 ml  Output      0 ml  Net    360 ml   Filed Weights   08/04/12 2153  Weight: 136.76 kg (301 lb 8 oz)    Exam:   General:  No distress  Cardiovascular: S 1, S 2 RRR  Respiratory: no wheezes, no crackles.  Abdomen: Bs present, soft.   Data Reviewed: Basic Metabolic Panel:  Recent Labs Lab 08/04/12 1600  NA 132*  K 4.7  CL 94*  CO2 26  GLUCOSE 109*  BUN 14  CREATININE 1.05  CALCIUM 8.9   Liver Function Tests:  Recent Labs Lab 08/04/12 1600  AST 20  ALT 29  ALKPHOS 101  BILITOT 0.8  PROT 8.0  ALBUMIN 3.4*  CBC:  Recent Labs Lab 08/04/12 1600  WBC 10.7*  NEUTROABS 8.0*  HGB 17.3*  HCT 50.9  MCV 87.6  PLT 167   Cardiac Enzymes: No results found for this basename: CKTOTAL, CKMB,  CKMBINDEX, TROPONINI,  in the last 168 hours BNP (last 3 results) No results found for this basename: PROBNP,  in the last 8760 hours CBG:  Recent Labs Lab 08/05/12 0730  GLUCAP 143*    No results found for this or any previous visit (from the past 240 hour(s)).   Studies: Dg Chest Port 1 View  (if Code Sepsis Called)  08/04/2012  *RADIOLOGY REPORT*  Clinical Data: Cough, shortness of breath  PORTABLE CHEST - 1 VIEW  Comparison: 05/27/2011  Findings: Cardiomediastinal silhouette is stable. No acute infiltrate or pleural effusion.  No pulmonary edema.  Central mild bronchitic changes.  IMPRESSION: No acute infiltrate or pulmonary edema.  Central mild bronchitic changes.   Original Report Authenticated By: Natasha Mead, M.D.     Scheduled Meds: . albuterol  2.5 mg Nebulization TID  . docusate sodium  100 mg Oral BID  . levofloxacin (LEVAQUIN) IV  500 mg Intravenous Q24H  . methylPREDNISolone (SOLU-MEDROL) injection  60 mg Intravenous Q4H  . oseltamivir  75 mg Oral BID   Continuous Infusions: . sodium chloride 100 mL/hr at 08/04/12 2349  . albuterol Stopped (08/04/12 1925)    Principal Problem:   Viral syndrome Active Problems:   Wheezing without diagnosis  of asthma    Time spent: 25 minutes    REGALADO,BELKYS  [facility] Pager [phone]. If 8PM-8AM, please contact night-coverage at [email or web address], password [facility] 08/05/2012, 10:33 AM  LOS: 1 day

## 2012-08-06 DIAGNOSIS — J189 Pneumonia, unspecified organism: Principal | ICD-10-CM

## 2012-08-06 LAB — BASIC METABOLIC PANEL
BUN: 15 mg/dL (ref 6–23)
Calcium: 8.8 mg/dL (ref 8.4–10.5)
GFR calc Af Amer: >90 mL/min (ref >90)
GFR calc non Af Amer: >90 mL/min (ref >90)
Potassium: 4.3 mEq/L (ref 3.5–5.1)
Sodium: 138 mEq/L (ref 135–145)

## 2012-08-06 LAB — CBC
Hemoglobin: 15.5 g/dL (ref 13.0–17.0)
MCHC: 32.9 g/dL (ref 30.0–36.0)
Platelets: 215 10*3/uL (ref 150–400)

## 2012-08-06 MED ORDER — GUAIFENESIN 200 MG PO TABS
400.0000 mg | ORAL_TABLET | Freq: Two times a day (BID) | ORAL | Status: DC
  Administered 2012-08-06 – 2012-08-07 (×3): 400 mg via ORAL
  Filled 2012-08-06 (×4): qty 2

## 2012-08-06 NOTE — Progress Notes (Signed)
[facility] PROGRESS NOTE  Jerome Thomas FAO:[id number] DOB: 08/03/1965 DOA: 08/04/2012 PCP: No primary provider on file.  Assessment/Plan: 1. Multilobar Bronchopneumonia: Will continue with antibiotics, levaquin day 3. Will continue with tamiflu to complete 5 days treatment. Will stop Tamiflu 2-22. Continue with nebulizer. HIV non reactive.  Will discontinue steroids and observe patient off steroids. Guaifenesin for cough.  2. Dyspnea: D dimer elevated. CT negative for PE.   3. Tobacco use: Will order cessation counseling.   Code Status: Full Code. Family Communication: care discussed with patient. Disposition Plan: Discharge 2-22 if stable.    Consultants:  None  Procedures:  None  Antibiotics:  Levaquin 2-19  HPI/Subjective: Feeling 80 % better. Still with some cough.   Objective: Filed Vitals:   08/05/12 2230 08/06/12 0603 08/06/12 0903 08/06/12 1329  BP: 139/81 125/73  167/79  Pulse: 85 83  89  Temp: 97.8 F (36.6 C) 97.4 F (36.3 C)  97.6 F (36.4 C)  TempSrc: Oral Oral  Oral  Resp: 16 16  16   Height:      Weight:      SpO2: 94% 92% 94% 95%    Intake/Output Summary (Last 24 hours) at 08/06/12 1339 Last data filed at 08/06/12 1328  Gross per 24 hour  Intake 4505.67 ml  Output      0 ml  Net 4505.67 ml   Filed Weights   08/04/12 2153  Weight: 136.76 kg (301 lb 8 oz)    Exam:   General:  No distress  Cardiovascular: S 1, S 2 RRR  Respiratory: no wheezes, no crackles.  Abdomen: Bs present, soft.   Data Reviewed: Basic Metabolic Panel:  Recent Labs Lab 08/04/12 1600 08/06/12 0351  NA 132* 138  K 4.7 4.3  CL 94* 104  CO2 26 25  GLUCOSE 109* 143*  BUN 14 15  CREATININE 1.05 0.73  CALCIUM 8.9 8.8   Liver Function Tests:  Recent Labs Lab 08/04/12 1600  AST 20  ALT 29  ALKPHOS 101  BILITOT 0.8  PROT 8.0  ALBUMIN 3.4*  CBC:  Recent Labs Lab 08/04/12 1600 08/06/12 0351  WBC 10.7* 14.7*  NEUTROABS 8.0*  --   HGB  17.3* 15.5  HCT 50.9 47.1  MCV 87.6 89.0  PLT 167 215   Cardiac Enzymes: No results found for this basename: CKTOTAL, CKMB, CKMBINDEX, TROPONINI,  in the last 168 hours BNP (last 3 results) No results found for this basename: PROBNP,  in the last 8760 hours CBG:  Recent Labs Lab 08/05/12 0730  GLUCAP 143*    No results found for this or any previous visit (from the past 240 hour(s)).   Studies: Ct Angio Chest Pe W/cm &/or Wo Cm  08/05/2012  *RADIOLOGY REPORT*  Clinical Data: Shortness of breath and elevated D-dimer.  Question pulmonary embolus.  CT ANGIOGRAPHY CHEST  Technique:  Multidetector CT imaging of the chest using the standard protocol during bolus administration of intravenous contrast. Multiplanar reconstructed images including MIPs were obtained and reviewed to evaluate the vascular anatomy.  Contrast: OMNIPAQUE IOHEXOL 350 MG/ML SOLN  Comparison: Chest radiograph 08/04/2012.  Findings: No pulmonary embolus.  Mediastinal lymph nodes measure up to 1.5 cm short axis in the low right paratracheal station. Bihilar lymph nodes measure up to 1.3 cm on the left.  No axillary adenopathy.  Heart size normal.  No pericardial effusion.  There are scattered areas of peribronchovascular nodularity and airspace consolidation with bronchial wall thickening.  Findings are seen in  all lobes of both lungs with the exception of the right middle lobe.  No pleural fluid.  Airway is otherwise unremarkable.  Incidental imaging of the upper abdomen shows a possible 9 mm low attenuation lesion in the dome of the liver, too small to characterize.  No worrisome lytic or sclerotic lesions.  IMPRESSION:  1.  Negative for pulmonary embolus. 2.  Patchy areas of peribronchovascular nodularity, consolidation and bronchial wall thickening are indicative of multilobar bronchopneumonia. 3.  Mildly enlarged mediastinal and hilar lymph nodes are likely reactive.   Original Report Authenticated By: Leanna Battles,  M.D.    Dg Chest Port 1 View  (if Code Sepsis Called)  08/04/2012  *RADIOLOGY REPORT*  Clinical Data: Cough, shortness of breath  PORTABLE CHEST - 1 VIEW  Comparison: 05/27/2011  Findings: Cardiomediastinal silhouette is stable. No acute infiltrate or pleural effusion.  No pulmonary edema.  Central mild bronchitic changes.  IMPRESSION: No acute infiltrate or pulmonary edema.  Central mild bronchitic changes.   Original Report Authenticated By: Natasha Mead, M.D.     Scheduled Meds: . albuterol  2.5 mg Nebulization TID  . docusate sodium  100 mg Oral BID  . guaiFENesin  400 mg Oral BID  . levofloxacin (LEVAQUIN) IV  500 mg Intravenous Q24H  . oseltamivir  75 mg Oral BID   Continuous Infusions: . sodium chloride 50 mL/hr at 08/06/12 1116  . albuterol Stopped (08/04/12 1925)    Principal Problem:   Viral syndrome Active Problems:   Wheezing without diagnosis of asthma    Time spent: 25 minutes    Jerome Thomas  [facility] Pager [phone]. If 8PM-8AM, please contact night-coverage at [email or web address], password [facility] 08/06/2012, 1:39 PM  LOS: 2 days

## 2012-08-06 NOTE — Care Management (Signed)
Cm spoke with pt concerning discharge planning. Pt ambulatory, independent PTA. No needs stated. PCP: [employer].    Leonie Green Case Manager [phone]

## 2012-08-06 NOTE — Plan of Care (Signed)
Problem: Phase I Progression Outcomes Goal: OOB as tolerated unless otherwise ordered Outcome: Completed/Met Date Met:  08/06/12 Up as tolerated in room

## 2012-08-07 LAB — CBC
MCV: 90.1 fL (ref 78.0–100.0)
Platelets: 218 10*3/uL (ref 150–400)
RBC: 5.15 MIL/uL (ref 4.22–5.81)
WBC: 15.5 10*3/uL — ABNORMAL HIGH (ref 4.0–10.5)

## 2012-08-07 MED ORDER — GUAIFENESIN 200 MG PO TABS: 400.0000 mg | ORAL_TABLET | Freq: Two times a day (BID) | ORAL | Status: DC

## 2012-08-07 MED ORDER — DSS 100 MG PO CAPS: 100.0000 mg | ORAL_CAPSULE | Freq: Two times a day (BID) | ORAL | Status: DC

## 2012-08-07 MED ORDER — ALBUTEROL SULFATE HFA 108 (90 BASE) MCG/ACT IN AERS: 2.0000 | INHALATION_SPRAY | Freq: Every day | RESPIRATORY_TRACT | Status: DC

## 2012-08-07 MED ORDER — ALBUTEROL SULFATE HFA 108 (90 BASE) MCG/ACT IN AERS
2.0000 | INHALATION_SPRAY | Freq: Four times a day (QID) | RESPIRATORY_TRACT | Status: DC
  Filled 2012-08-07: qty 6.7

## 2012-08-07 MED ORDER — LEVOFLOXACIN 750 MG PO TABS: 750.0000 mg | ORAL_TABLET | Freq: Every day | ORAL | Status: DC

## 2012-08-07 NOTE — Discharge Summary (Addendum)
Physician Discharge Summary  Jerome Thomas ZOX:[id number] DOB: 21-Oct-1965 DOA: 08/04/2012  PCP: No primary provider on file.  Admit date: 08/04/2012 Discharge date: 08/07/2012  Time spent: 30 minutes  Recommendations for Outpatient Follow-up:  1. Needs to follow up with PCP. Could consider PFT.   Discharge Diagnoses:    CAP (community acquired pneumonia)   Viral syndrome   Wheezing without diagnosis of asthma    Discharge Condition: Improved.   Diet recommendation: Heart Healthy.  Filed Weights   08/04/12 2153  Weight: 136.76 kg (301 lb 8 oz)    History of present illness:  Jerome Thomas is an 47 y.o. male with benign past medical hx on no chronic meds, presents to ER with persistent cough, myalgia, subjective fever, and shortness of breath with wheezing. He was seen in an urgent care about 4 days ago, and was placed with Zpak along with Tamiflu and ventolin Inhaler. Evaluation in the ER included unremarkable serology, negative CXR with central bronchitic changes, and slightly low Na with Hb of 17g/DL. He does smoke cigarettes of about 1.5 ppd for many years. He drinks only on the weekend. In the ER, he was given several nebs along with IV steroids, and hospitalist was asked to admit him for further tx of viral syndrome with significant wheezing.   Hospital Course:  1. Multilobar Bronchopneumonia: Patient admitted with dyspnea, cough, CT show multilobar PNA. He has improved. He was treated with  Levaquin IV for 3 days.  He completed  5 days of tamiflu. Continue with nebulizer. HIV non reactive. Patient was stared on IV solumedrol due to wheezes lung exam. steroids discontinue, no worsening dyspnea. Guaifenesin for cough. Patient afebrile, dyspnea improved. Leukocytosis in setting of steroids.  2. Dyspnea: D dimer elevated. CT negative for PE.  3. Tobacco use: Will order cessation counseling.  4.   Procedures:  none  Consultations:  none  Discharge Exam: Filed Vitals:    08/06/12 2130 08/06/12 2140 08/06/12 2157 08/07/12 0415  BP: 150/88   124/76  Pulse: 75 78  65  Temp: 97.9 F (36.6 C)   97.5 F (36.4 C)  TempSrc: Oral   Oral  Resp: 18  18 18   Height:      Weight:      SpO2: 95%   95%    General: no distress. Cardiovascular: S 1, S 2 RRR Respiratory: sporadic ronchus.  Discharge Instructions  Discharge Orders   Future Orders Complete By Expires     Diet - low sodium heart healthy  As directed     Increase activity slowly  As directed         Medication List    STOP taking these medications       azithromycin 500 MG tablet  Commonly known as:  ZITHROMAX     oseltamivir 75 MG capsule  Commonly known as:  TAMIFLU      TAKE these medications       albuterol 108 (90 BASE) MCG/ACT inhaler  Commonly known as:  PROVENTIL HFA;VENTOLIN HFA  Inhale 2 puffs into the lungs 6 (six) times daily.     DSS 100 MG Caps  Take 100 mg by mouth 2 (two) times daily.     guaiFENesin 200 MG tablet  Take 2 tablets (400 mg total) by mouth 2 (two) times daily.     levofloxacin 750 MG tablet  Commonly known as:  LEVAQUIN  Take 1 tablet (750 mg total) by mouth daily.  Follow-up Information   Please follow up. (follow with PCP whitin 1 week. )        The results of significant diagnostics from this hospitalization (including imaging, microbiology, ancillary and laboratory) are listed below for reference.    Significant Diagnostic Studies: Ct Angio Chest Pe W/cm &/or Wo Cm  08/05/2012  *RADIOLOGY REPORT*  Clinical Data: Shortness of breath and elevated D-dimer.  Question pulmonary embolus.  CT ANGIOGRAPHY CHEST  Technique:  Multidetector CT imaging of the chest using the standard protocol during bolus administration of intravenous contrast. Multiplanar reconstructed images including MIPs were obtained and reviewed to evaluate the vascular anatomy.  Contrast: OMNIPAQUE IOHEXOL 350 MG/ML SOLN  Comparison: Chest radiograph  08/04/2012.  Findings: No pulmonary embolus.  Mediastinal lymph nodes measure up to 1.5 cm short axis in the low right paratracheal station. Bihilar lymph nodes measure up to 1.3 cm on the left.  No axillary adenopathy.  Heart size normal.  No pericardial effusion.  There are scattered areas of peribronchovascular nodularity and airspace consolidation with bronchial wall thickening.  Findings are seen in all lobes of both lungs with the exception of the right middle lobe.  No pleural fluid.  Airway is otherwise unremarkable.  Incidental imaging of the upper abdomen shows a possible 9 mm low attenuation lesion in the dome of the liver, too small to characterize.  No worrisome lytic or sclerotic lesions.  IMPRESSION:  1.  Negative for pulmonary embolus. 2.  Patchy areas of peribronchovascular nodularity, consolidation and bronchial wall thickening are indicative of multilobar bronchopneumonia. 3.  Mildly enlarged mediastinal and hilar lymph nodes are likely reactive.   Original Report Authenticated By: Leanna Battles, M.D.    Dg Chest Port 1 View  (if Code Sepsis Called)  08/04/2012  *RADIOLOGY REPORT*  Clinical Data: Cough, shortness of breath  PORTABLE CHEST - 1 VIEW  Comparison: 05/27/2011  Findings: Cardiomediastinal silhouette is stable. No acute infiltrate or pleural effusion.  No pulmonary edema.  Central mild bronchitic changes.  IMPRESSION: No acute infiltrate or pulmonary edema.  Central mild bronchitic changes.   Original Report Authenticated By: Natasha Mead, M.D.     Microbiology: No results found for this or any previous visit (from the past 240 hour(s)).   Labs: Basic Metabolic Panel:  Recent Labs Lab 08/04/12 1600 08/06/12 0351  NA 132* 138  K 4.7 4.3  CL 94* 104  CO2 26 25  GLUCOSE 109* 143*  BUN 14 15  CREATININE 1.05 0.73  CALCIUM 8.9 8.8   Liver Function Tests:  Recent Labs Lab 08/04/12 1600  AST 20  ALT 29  ALKPHOS 101  BILITOT 0.8  PROT 8.0  ALBUMIN 3.4*    CBC:  Recent Labs Lab 08/04/12 1600 08/06/12 0351 08/07/12 0411  WBC 10.7* 14.7* 15.5*  NEUTROABS 8.0*  --   --   HGB 17.3* 15.5 15.0  HCT 50.9 47.1 46.4  MCV 87.6 89.0 90.1  PLT 167 215 218   Cardiac Enzymes: No results found for this basename: CKTOTAL, CKMB, CKMBINDEX, TROPONINI,  in the last 168 hours BNP: BNP (last 3 results) No results found for this basename: PROBNP,  in the last 8760 hours CBG:  Recent Labs Lab 08/05/12 0730  GLUCAP 143*       Signed:  Danaya Geddis  [facility] 08/07/2012, 7:47 AM

## 2012-08-07 NOTE — Plan of Care (Signed)
Problem: Phase III Progression Outcomes Goal: Other Phase III Outcomes/Goals Outcome: Completed/Met Date Met:  08/07/12 Given IS for use

## 2012-10-05 ENCOUNTER — Encounter: Payer: Self-pay | Admitting: Family Medicine

## 2012-10-05 ENCOUNTER — Ambulatory Visit (INDEPENDENT_AMBULATORY_CARE_PROVIDER_SITE_OTHER): Payer: BC Managed Care – PPO | Admitting: Family Medicine

## 2012-10-05 VITALS — BP 120/76 | HR 84 | Temp 98.1°F | Ht 75.75 in | Wt 311.8 lb

## 2012-10-05 DIAGNOSIS — R0609 Other forms of dyspnea: Secondary | ICD-10-CM

## 2012-10-05 DIAGNOSIS — J449 Chronic obstructive pulmonary disease, unspecified: Secondary | ICD-10-CM

## 2012-10-05 DIAGNOSIS — F17200 Nicotine dependence, unspecified, uncomplicated: Secondary | ICD-10-CM

## 2012-10-05 DIAGNOSIS — Z8701 Personal history of pneumonia (recurrent): Secondary | ICD-10-CM

## 2012-10-05 DIAGNOSIS — R0989 Other specified symptoms and signs involving the circulatory and respiratory systems: Secondary | ICD-10-CM

## 2012-10-05 DIAGNOSIS — J441 Chronic obstructive pulmonary disease with (acute) exacerbation: Secondary | ICD-10-CM | POA: Insufficient documentation

## 2012-10-05 DIAGNOSIS — R0683 Snoring: Secondary | ICD-10-CM | POA: Insufficient documentation

## 2012-10-05 MED ORDER — ALBUTEROL SULFATE HFA 108 (90 BASE) MCG/ACT IN AERS: 2.0000 | INHALATION_SPRAY | Freq: Four times a day (QID) | RESPIRATORY_TRACT | Status: DC | PRN

## 2012-10-05 NOTE — Assessment & Plan Note (Addendum)
Refer to pulm for sleep apnea eval/ sleep study Did discuss diet and exercise

## 2012-10-05 NOTE — Patient Instructions (Addendum)

## 2012-10-05 NOTE — Progress Notes (Signed)
  Subjective:    Patient ID: Jerome Thomas, male    DOB: 01/11/1966, 47 y.o.   MRN: [id number]  HPI Pt here to establish and at the request of his wife.  She is worried he has copd because of his smoking and hx pneumonia last Feb '14.  He is now using an e cig and is dow to 1 ppd. Pt denies any trouble breathing unless he is sick.  No other complaints.   Review of Systems As above    Objective:   Physical Exam BP 120/76  Pulse 84  Temp(Src) 98.1 F (36.7 C) (Oral)  Ht 6' 3.75" (1.924 m)  Wt 311 lb 12.8 oz (141.432 kg)  BMI 38.21 kg/m2  SpO2 97% General appearance: alert, cooperative, appears stated age and no distress Neck: no adenopathy, supple, symmetrical, trachea midline and thyroid not enlarged, symmetric, no tenderness/mass/nodules Lungs: clear to auscultation bilaterally Heart: regular rate and rhythm, S1, S2 normal, no murmur, click, rub or gallop Extremities: extremities normal, atraumatic, no cyanosis or edema  PFTs--- mod restriction      Assessment & Plan:

## 2012-10-05 NOTE — Assessment & Plan Note (Addendum)
proair inh refill See PFT rto cpe

## 2012-10-05 NOTE — Assessment & Plan Note (Signed)
con't e cig

## 2012-11-01 ENCOUNTER — Institutional Professional Consult (permissible substitution): Payer: BC Managed Care – PPO | Admitting: Pulmonary Disease

## 2012-11-04 IMAGING — CR DG HIP 1V PORT*R*
1 series · 2 of 2 positions shown · non-contrast
Comparison: None.

CLINICAL DATA: Hip replacement.

PORTABLE RIGHT HIP - 1 VIEW

[Series 1: shoot- thru lateral · right · 2 of 2 slices shown]
[im 1/2]
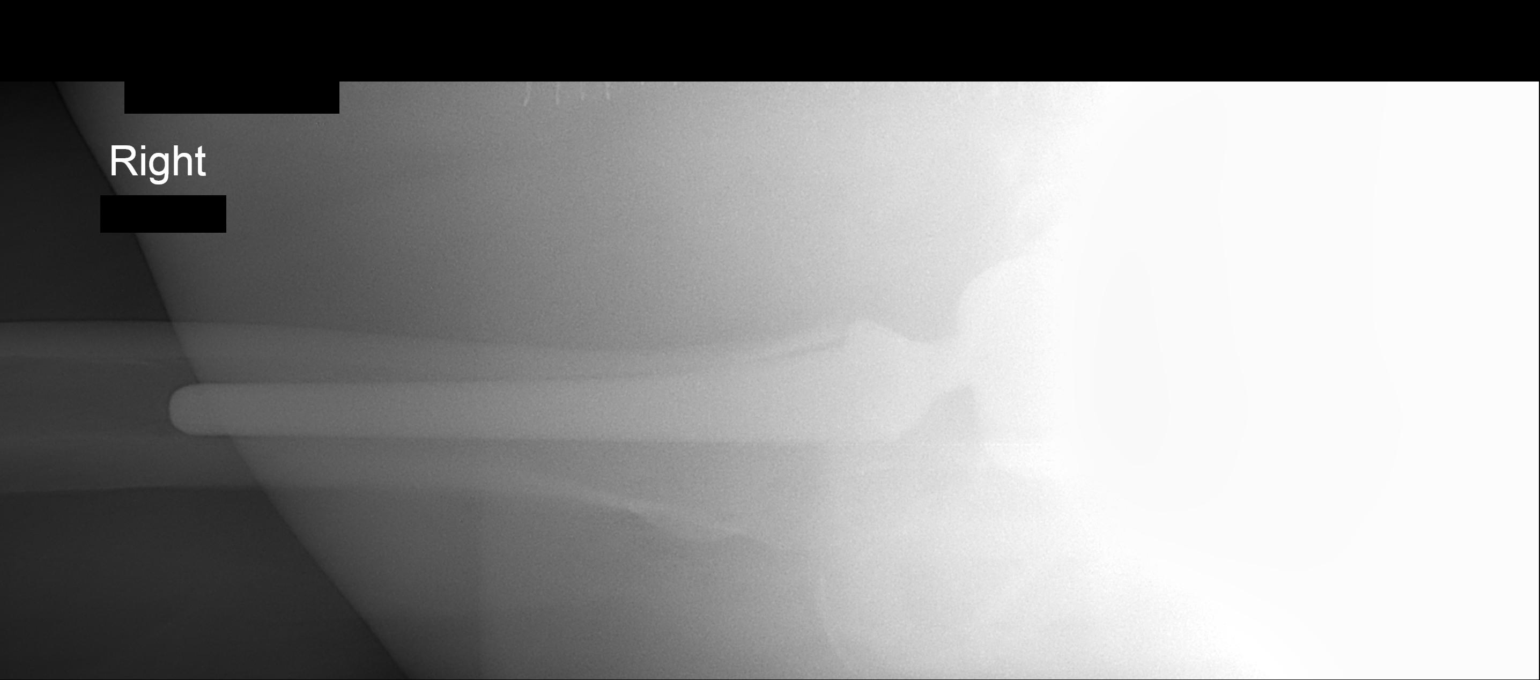
[im 2/2]
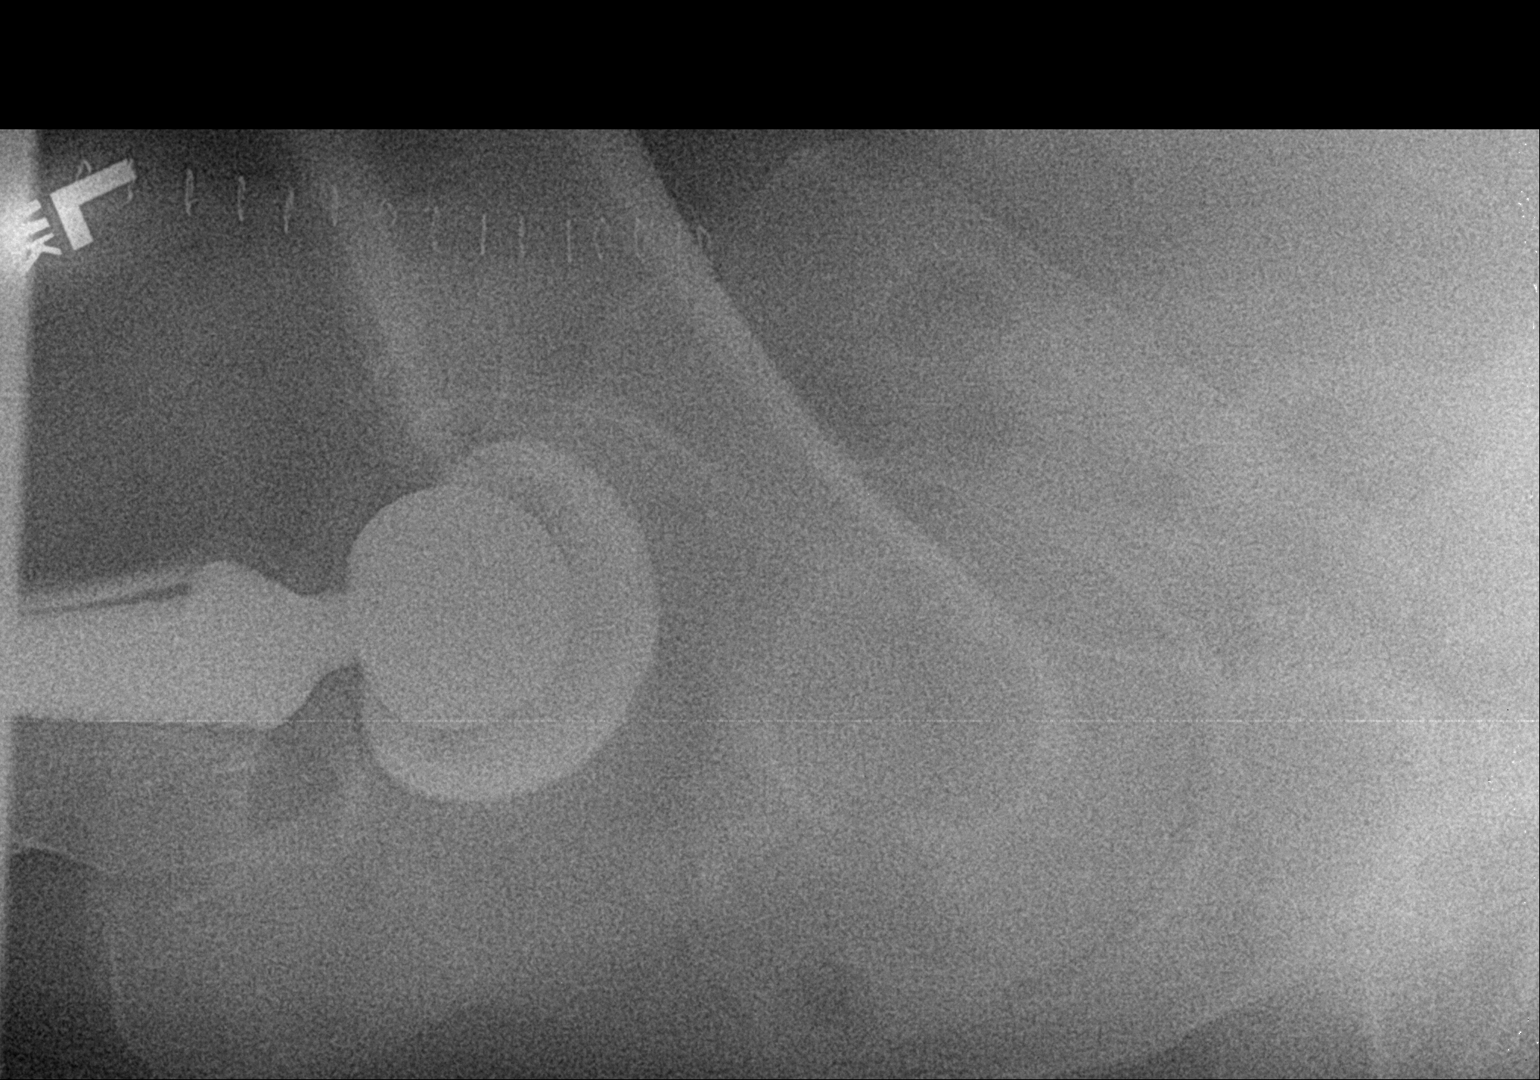

[2 of 2 positions shown; findings below may reference images not displayed]

FINDINGS: New right total hip arthroplasty is in place.  The device
is located.  No fracture is identified.  Surgical staples noted.
IMPRESSION: Right total hip without evidence of complication.

## 2014-01-11 IMAGING — CT CT ANGIO CHEST
2 of 6 series · 19 of 36 positions shown · IV contrast (OMNIPAQUE)
Comparison: Chest radiograph 08/04/2012.

CLINICAL DATA: Shortness of breath and elevated D-dimer.  Question
pulmonary embolus.

CT ANGIOGRAPHY CHEST
TECHNIQUE: Multidetector CT imaging of the chest using the
standard protocol during bolus administration of intravenous
contrast. Multiplanar reconstructed images including MIPs were
obtained and reviewed to evaluate the vascular anatomy.
Contrast: 100mL OMNIPAQUE IOHEXOL 350 MG/ML SOLN

[Series 6: pe thins @ 1mm · axial · 0.79mm/px · z∈[-278,-26]mm · 18 of 282 slices shown]
[im 15/282  lung]
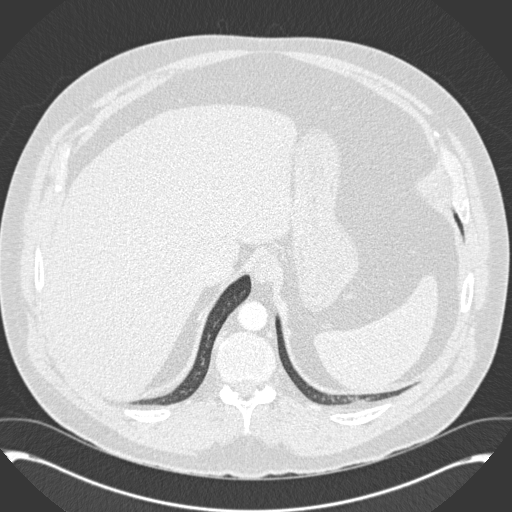
[im 29/282  mediastinal]
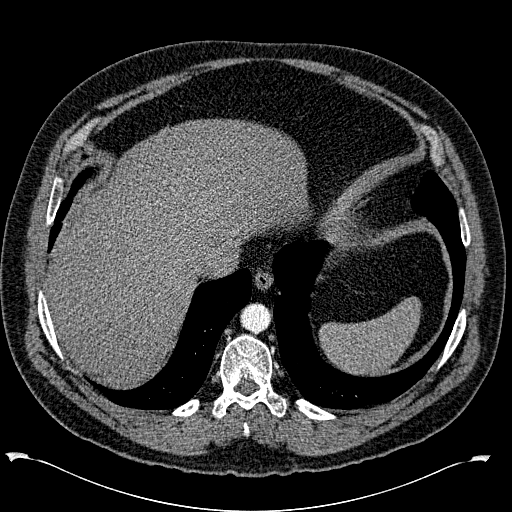
[im 43/282  lung]
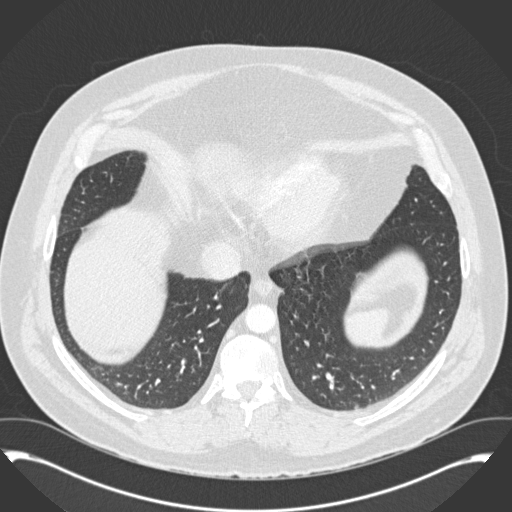
[im 57/282  mediastinal]
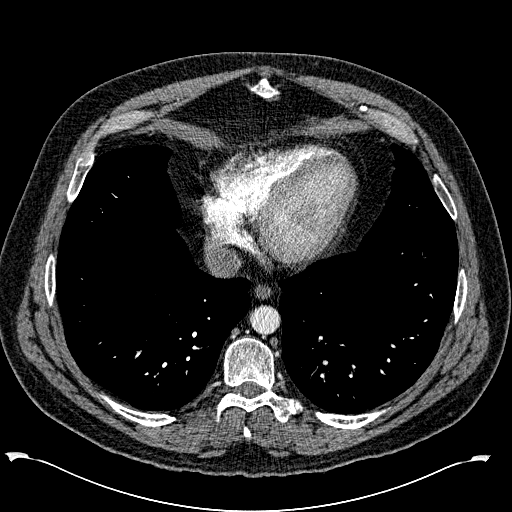
[im 71/282  lung]
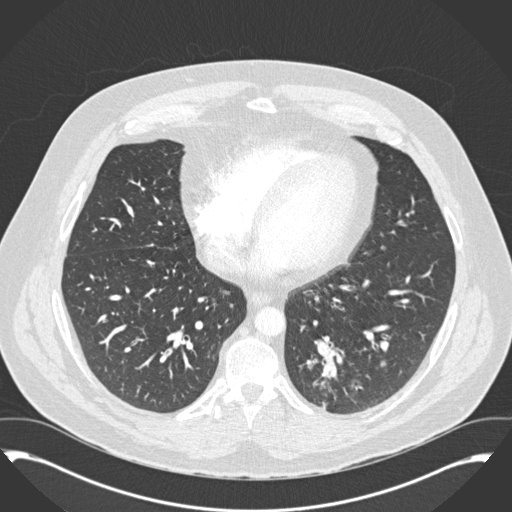
[im 85/282  mediastinal]
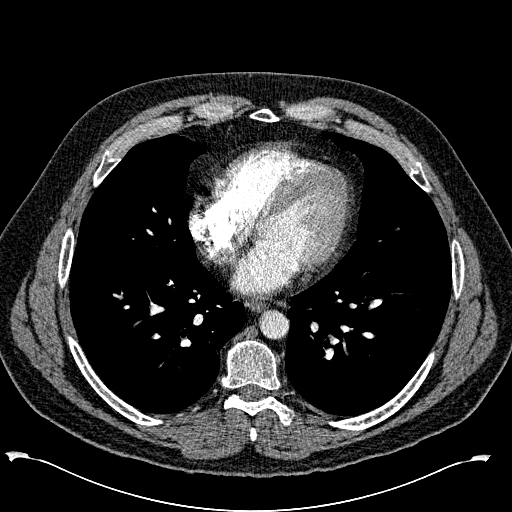
[im 99/282  lung]
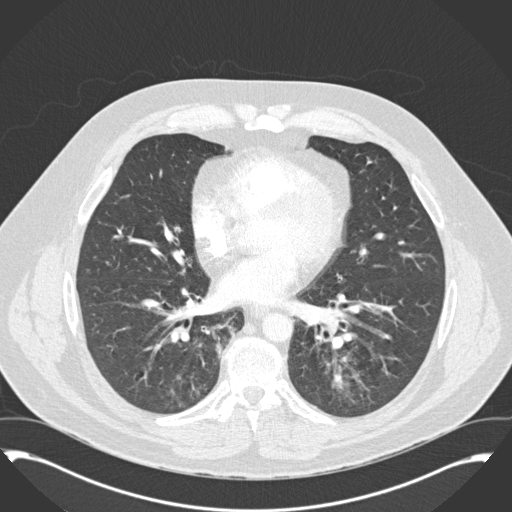
[im 113/282  mediastinal]
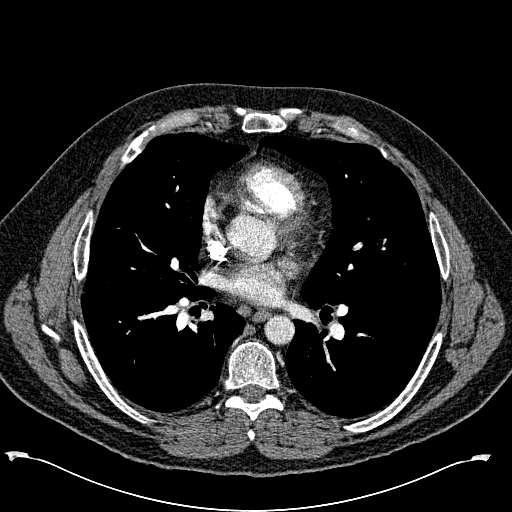
[im 127/282  lung]
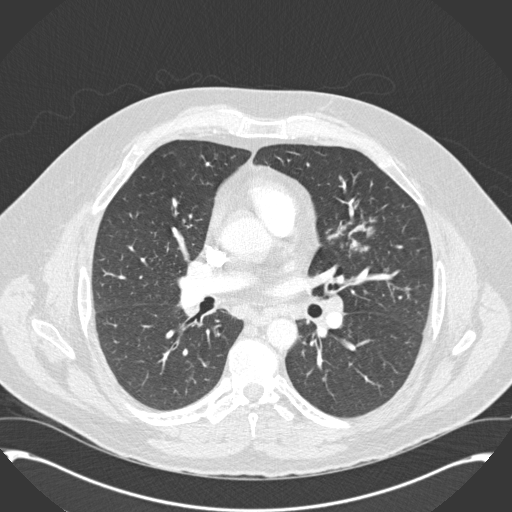
[im 155/282  mediastinal]
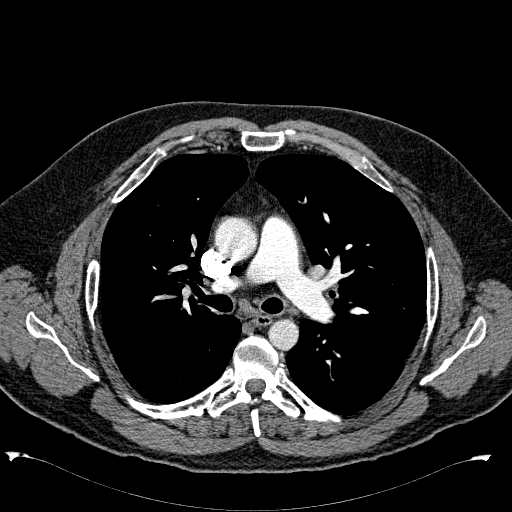
[im 169/282  lung]
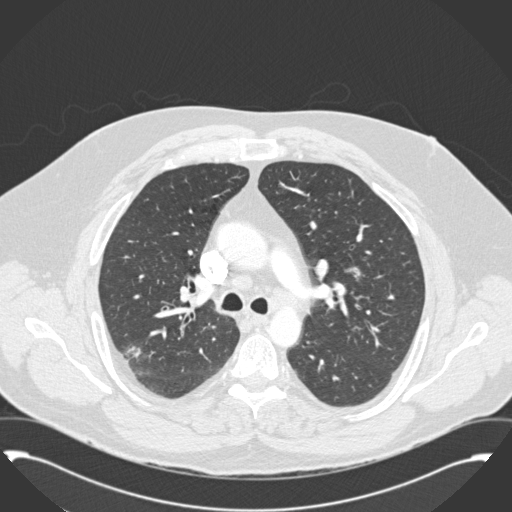
[im 183/282  mediastinal]
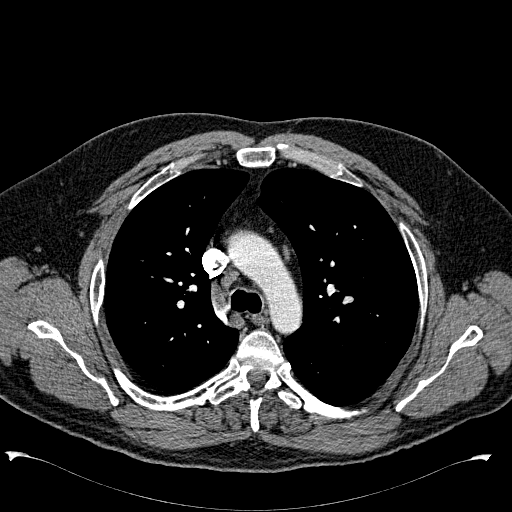
[im 197/282  lung]
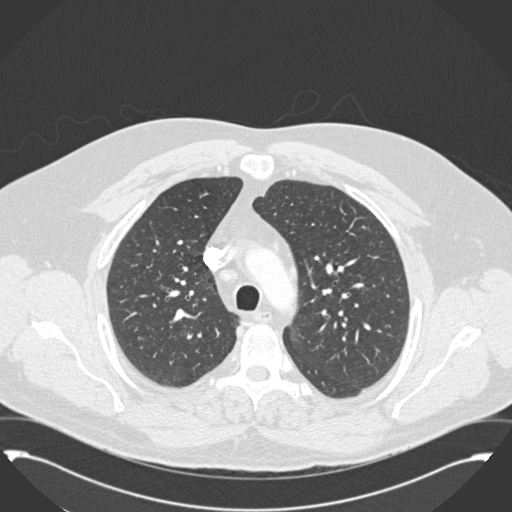
[im 211/282  mediastinal]
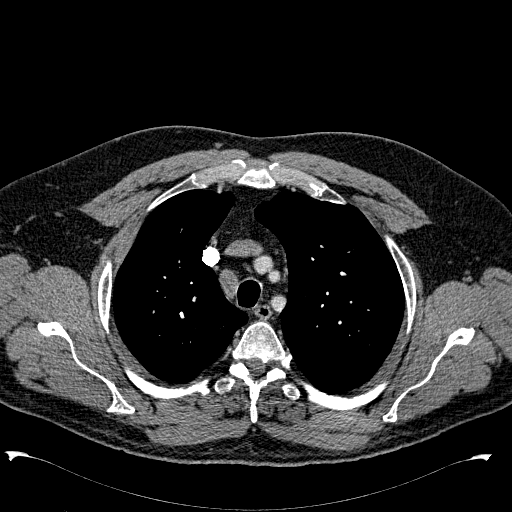
[im 225/282  lung]
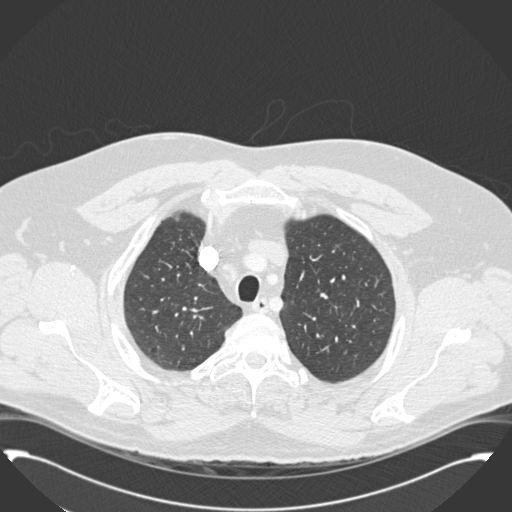
[im 239/282  mediastinal]
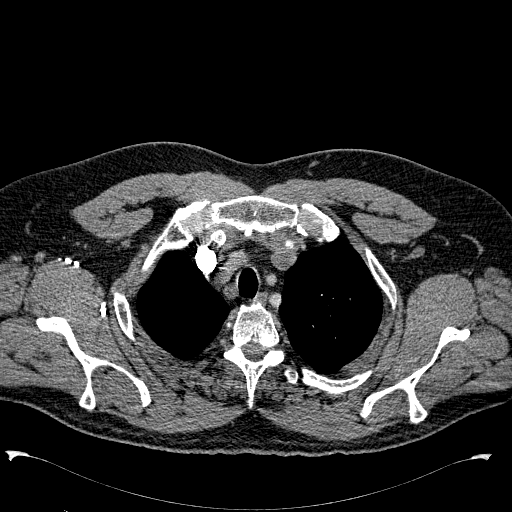
[im 253/282  lung]
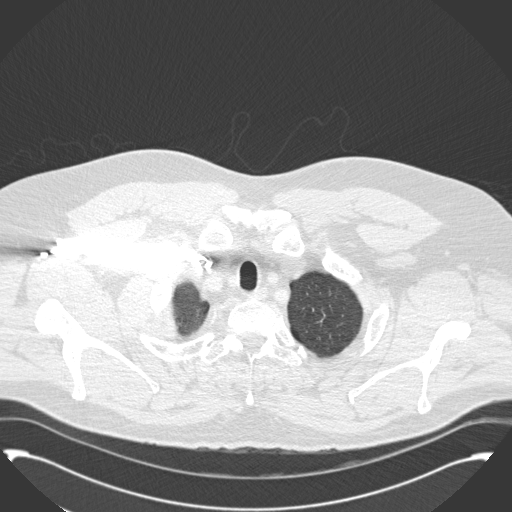
[im 267/282  mediastinal]
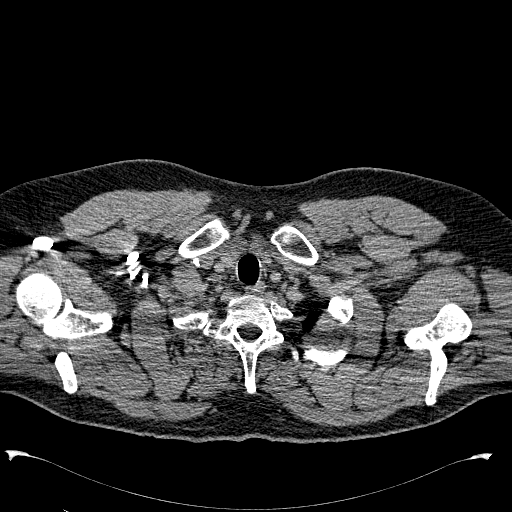

[Series 602: <mpr thick range> · coronal · 0.79mm/px · 1 of 137 slices shown]
[im 69/137  mediastinal]
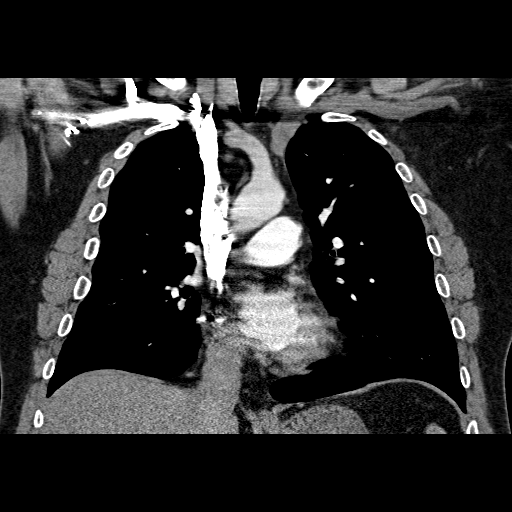

[19 of 36 positions shown; findings below may reference images not displayed]

FINDINGS: No pulmonary embolus.  Mediastinal lymph nodes measure up
to 1.5 cm short axis in the low right paratracheal station.
Bihilar lymph nodes measure up to 1.3 cm on the left.  No axillary
adenopathy.  Heart size normal.  No pericardial effusion.

There are scattered areas of peribronchovascular nodularity and
airspace consolidation with bronchial wall thickening.  Findings
are seen in all lobes of both lungs with the exception of the right
middle lobe.  No pleural fluid.  Airway is otherwise unremarkable.

Incidental imaging of the upper abdomen shows a possible 9 mm low
attenuation lesion in the dome of the liver, too small to
characterize.  No worrisome lytic or sclerotic lesions.
IMPRESSION: 1.  Negative for pulmonary embolus.
2.  Patchy areas of peribronchovascular nodularity, consolidation
and bronchial wall thickening are indicative of multilobar
bronchopneumonia.
3.  Mildly enlarged mediastinal and hilar lymph nodes are likely
reactive.

## 2015-11-07 ENCOUNTER — Ambulatory Visit
Admission: RE | Admit: 2015-11-07 | Discharge: 2015-11-07 | Disposition: A | Discharge status: Home / Self Care | Payer: PRIVATE HEALTH INSURANCE | Source: Ambulatory Visit | Attending: Nurse Practitioner | Admitting: Nurse Practitioner

## 2015-11-07 ENCOUNTER — Other Ambulatory Visit: Payer: Self-pay | Admitting: Nurse Practitioner

## 2015-11-07 DIAGNOSIS — R059 Cough, unspecified: Secondary | ICD-10-CM

## 2015-11-07 DIAGNOSIS — R05 Cough: Secondary | ICD-10-CM

## 2015-11-07 DIAGNOSIS — F17200 Nicotine dependence, unspecified, uncomplicated: Secondary | ICD-10-CM

## 2016-10-24 ENCOUNTER — Encounter: Payer: Self-pay | Admitting: Primary Care

## 2016-10-24 ENCOUNTER — Ambulatory Visit (INDEPENDENT_AMBULATORY_CARE_PROVIDER_SITE_OTHER): Payer: 59 | Admitting: Primary Care

## 2016-10-24 DIAGNOSIS — F17200 Nicotine dependence, unspecified, uncomplicated: Secondary | ICD-10-CM | POA: Diagnosis not present

## 2016-10-24 DIAGNOSIS — J449 Chronic obstructive pulmonary disease, unspecified: Secondary | ICD-10-CM

## 2016-10-24 NOTE — Assessment & Plan Note (Signed)
Discussed importance of tobacco cessation. 

## 2016-10-24 NOTE — Patient Instructions (Signed)
Please schedule a physical with me in October 2018. You may also schedule a lab only appointment 3-4 days prior. We will discuss your lab results in detail during your physical.  Please notify me when you are ready to stop smoking.   It was a pleasure to meet you today! Please don't hesitate to call me with any questions. Welcome to [facility]!

## 2016-10-24 NOTE — Progress Notes (Signed)
Subjective:    Patient ID: Jerome Thomas, male    DOB: 11/27/1965, 51 y.o.   MRN: [id number]  HPI  Jerome Thomas is a 51 year old male who presents today to establish care and discuss the problems mentioned below. Will obtain old records. His last physical was in October/November 2017.   1) COPD: Currently managed on albuterol HFA for which he uses infrequently, last use being several months ago. Current smoker of cigarettes since age 64, regular smoker since age 23. He smokes 1 PPD. He denies chest pain, shortness of breath, wheezing. He has attempted to quit smoking with Chantix but wasn't ready to stop smoking at that time so this was ineffective. He is not ready to quit today.  Review of Systems  Constitutional: Negative for fatigue and unexpected weight change.  Respiratory: Negative for shortness of breath and wheezing.   Cardiovascular: Negative for chest pain.  Neurological: Negative for dizziness and headaches.       Past Medical History:  Diagnosis Date  . Avascular necrosis (HCC)    right hip  . COPD (chronic obstructive pulmonary disease) ([place])   . Genital warts      Social History   Social History  . Marital status: Married    Spouse name: N/A  . Number of children: N/A  . Years of education: N/A   Occupational History  . 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 Smoking status: Current Every Day Smoker    Packs/day: 1.00    Years: 30.00    Types: Cigarettes  . Smokeless tobacco: Never Used  . Alcohol use Yes     Comment: occasional  . Drug use: No  . Sexual activity: Yes    Partners: Female   Other Topics Concern  . Not on file   Social History Narrative   Married.   1 child.    Works as a Dealer.   Enjoys riding 4-wheelers.     Past Surgical History:  Procedure Laterality Date  . ARTHROSCOPY KNEE W/ DRILLING  1980's   left knee  . HERNIA REPAIR  yrs ago   x 2  . TOTAL HIP ARTHROPLASTY  05/30/2011   Procedure: TOTAL HIP ARTHROPLASTY ANTERIOR APPROACH;  Surgeon: Mcarthur Rossetti;  Location: [facility];  Service: Orthopedics;  Laterality: Right;  Right Total Hip Arthroplasty, Direct Anterior Approach (C-Arm)    Family History  Problem Relation Age of Onset  . Arthritis Father     No Known Allergies  Current Outpatient Prescriptions on File Prior to Visit  Medication Sig Dispense Refill  . albuterol (PROAIR HFA) 108 (90 BASE) MCG/ACT inhaler Inhale 2 puffs into the lungs every 6 (six) hours as needed for wheezing. (Patient not taking: Reported on 10/24/2016) 1 Inhaler 2   No current facility-administered medications on file prior to visit.     BP 122/78   Pulse 93   Temp 98 F (36.7 C) (Oral)   Ht 6\' 4"  (1.93 m)   Wt (!) 353 lb 6.4 oz (160.3 kg)   SpO2 94%   BMI 43.02 kg/m    Objective:   Physical Exam  Constitutional: He is oriented to person, place, and time. He appears well-nourished.  Neck: Neck supple.  Cardiovascular: Normal rate and regular rhythm.   Pulmonary/Chest: Effort normal and breath sounds normal. He has no wheezes. He has no rales.  Neurological: He is alert and oriented to person, place, and time.  Skin:  Skin is warm and dry.  Psychiatric: He has a normal mood and affect.          Assessment & Plan:

## 2016-10-24 NOTE — Progress Notes (Signed)
Pre visit review using our clinic review tool, if applicable. No additional management support is needed unless otherwise documented below in the visit note. 

## 2016-10-24 NOTE — Assessment & Plan Note (Signed)
Infrequent use of albuterol. Discussed to stop smoking, he is not ready. Discussed to notify when ready, will offer assistance.

## 2016-12-05 ENCOUNTER — Ambulatory Visit (INDEPENDENT_AMBULATORY_CARE_PROVIDER_SITE_OTHER)
Admission: RE | Admit: 2016-12-05 | Discharge: 2016-12-05 | Disposition: A | Discharge status: Home / Self Care | Payer: 59 | Source: Ambulatory Visit | Attending: Primary Care | Admitting: Primary Care

## 2016-12-05 ENCOUNTER — Ambulatory Visit (INDEPENDENT_AMBULATORY_CARE_PROVIDER_SITE_OTHER): Payer: 59 | Admitting: Primary Care

## 2016-12-05 ENCOUNTER — Encounter: Payer: Self-pay | Admitting: Primary Care

## 2016-12-05 VITALS — BP 122/74 | HR 98 | Temp 97.8°F | Ht 75.75 in | Wt 348.1 lb

## 2016-12-05 DIAGNOSIS — J069 Acute upper respiratory infection, unspecified: Secondary | ICD-10-CM

## 2016-12-05 MED ORDER — FLUTICASONE PROPIONATE 50 MCG/ACT NA SUSP: 1.0000 | Freq: Two times a day (BID) | NASAL | 1 refills | Status: DC

## 2016-12-05 MED ORDER — HYDROCODONE-HOMATROPINE 5-1.5 MG/5ML PO SYRP: 5.0000 mL | ORAL_SOLUTION | Freq: Every evening | ORAL | 0 refills | Status: DC | PRN

## 2016-12-05 NOTE — Progress Notes (Signed)
Subjective:    Patient ID: Cherlynn June, male    DOB: 08-Jul-1965, 51 y.o.   MRN: [id number]  HPI  Mr. Stcyr is a 51 year old male with a history of COPD and tobacco abuse who presents today with a chief complaint of cough. He also reports nasal congestion, rhinorrhea. His cough is worse at night and is dry in nature. His symptoms began 8 days ago.  He's taken Dayquil and Nyquil without much improvement. Overall he feels about the same. He denies fevers, sore throat, shortness of breath.   Review of Systems  Constitutional: Negative for chills, fatigue and fever.  HENT: Positive for congestion. Negative for ear pain, sinus pressure and sore throat.   Respiratory: Positive for cough. Negative for shortness of breath.   Cardiovascular: Negative for chest pain and palpitations.       Past Medical History:  Diagnosis Date  . Avascular necrosis (HCC)    right hip  . COPD (chronic obstructive pulmonary disease) ([place])   . Genital warts      Social History   Social History  . Marital status: Married    Spouse name: N/A  . Number of children: N/A  . Years of education: N/A   Occupational History  . 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 Smoking status: Current Every Day Smoker    Packs/day: 1.00    Years: 30.00    Types: Cigarettes  . Smokeless tobacco: Never Used  . Alcohol use Yes     Comment: occasional  . Drug use: No  . Sexual activity: Yes    Partners: Female   Other Topics Concern  . Not on file   Social History Narrative   Married.   1 child.    Works as a Dealer.   Enjoys riding 4-wheelers.     Past Surgical History:  Procedure Laterality Date  . ARTHROSCOPY KNEE W/ DRILLING  1980's   left knee  . HERNIA REPAIR  yrs ago   x 2  . TOTAL HIP ARTHROPLASTY  05/30/2011   Procedure: TOTAL HIP ARTHROPLASTY ANTERIOR APPROACH;  Surgeon: Mcarthur Rossetti;  Location: [facility];  Service: Orthopedics;  Laterality: Right;   Right Total Hip Arthroplasty, Direct Anterior Approach (C-Arm)    Family History  Problem Relation Age of Onset  . Arthritis Father     No Known Allergies  Current Outpatient Prescriptions on File Prior to Visit  Medication Sig Dispense Refill  . albuterol (PROAIR HFA) 108 (90 BASE) MCG/ACT inhaler Inhale 2 puffs into the lungs every 6 (six) hours as needed for wheezing. 1 Inhaler 2   No current facility-administered medications on file prior to visit.     BP 122/74   Pulse 98   Temp 97.8 F (36.6 C) (Oral)   Ht 6' 3.75" (1.924 m)   Wt (!) 348 lb 1.9 oz (157.9 kg)   SpO2 92%   BMI 42.65 kg/m    Objective:   Physical Exam  Constitutional: He appears well-nourished. He does not appear ill.  HENT:  Right Ear: Tympanic membrane and ear canal normal.  Left Ear: Tympanic membrane and ear canal normal.  Nose: No mucosal edema. Right sinus exhibits no maxillary sinus tenderness and no frontal sinus tenderness. Left sinus exhibits no maxillary sinus tenderness and no frontal sinus tenderness.  Mouth/Throat: Oropharynx is clear and moist.  Eyes: Conjunctivae are normal.  Neck: Neck supple.  Cardiovascular: Normal rate and regular  rhythm.   Pulmonary/Chest: Effort normal. He has no wheezes. He has rhonchi in the right upper field, the right lower field, the left upper field and the left lower field. He has no rales.  Mild Rhonchi  Skin: Skin is warm and dry.          Assessment & Plan:  URI:  Cough, congestion, rhinorrhea x 8 days. Exam today with mild rhonchi throughout, is a current tobacco abuser. Suspect viral URI but will obtain chest xray today given lung sounds and history of CAP. Not sure if this is his baseline or not. Rx for Hycodan provided. Discussed use of Flonase and Mucinex. I do think he'll improve on his own and that his symptoms are viral. He will notify no later than Tuesday next week if no improvement. At that time would recommend  Zpak.  Sheral Flow, NP

## 2016-12-05 NOTE — Patient Instructions (Signed)
Complete xray(s) prior to leaving today.   You may take the Hycodan cough suppressant at bedtime as needed for cough and rest. Caution this medication contains codeine and will make you feel drowsy.  Nasal Congestion/Sinus/Ear Pressure: Try using Flonase (fluticasone) nasal spray. Instill 1 spray in each nostril twice daily.   Cough/Congestion: Try taking Mucinex DM. This will help loosen up the mucous in your chest. Ensure you take this medication with a full glass of water.  I'll be in touch later today regarding your chest xray result.  It was a pleasure to see you today!   Upper Respiratory Infection, Adult Most upper respiratory infections (URIs) are a viral infection of the air passages leading to the lungs. A URI affects the nose, throat, and upper air passages. The most common type of URI is nasopharyngitis and is typically referred to as "the common cold." URIs run their course and usually go away on their own. Most of the time, a URI does not require medical attention, but sometimes a bacterial infection in the upper airways can follow a viral infection. This is called a secondary infection. Sinus and middle ear infections are common types of secondary upper respiratory infections. Bacterial pneumonia can also complicate a URI. A URI can worsen asthma and chronic obstructive pulmonary disease (COPD). Sometimes, these complications can require emergency medical care and may be life threatening. What are the causes? Almost all URIs are caused by viruses. A virus is a type of germ and can spread from one person to another. What increases the risk? You may be at risk for a URI if:  You smoke.  You have chronic heart or lung disease.  You have a weakened defense (immune) system.  You are very young or very old.  You have nasal allergies or asthma.  You work in crowded or poorly ventilated areas.  You work in health care facilities or schools.  What are the signs or  symptoms? Symptoms typically develop 2-3 days after you come in contact with a cold virus. Most viral URIs last 7-10 days. However, viral URIs from the influenza virus (flu virus) can last 14-18 days and are typically more severe. Symptoms may include:  Runny or stuffy (congested) nose.  Sneezing.  Cough.  Sore throat.  Headache.  Fatigue.  Fever.  Loss of appetite.  Pain in your forehead, behind your eyes, and over your cheekbones (sinus pain).  Muscle aches.  How is this diagnosed? Your health care provider may diagnose a URI by:  Physical exam.  Tests to check that your symptoms are not due to another condition such as: ? Strep throat. ? Sinusitis. ? Pneumonia. ? Asthma.  How is this treated? A URI goes away on its own with time. It cannot be cured with medicines, but medicines may be prescribed or recommended to relieve symptoms. Medicines may help:  Reduce your fever.  Reduce your cough.  Relieve nasal congestion.  Follow these instructions at home:  Take medicines only as directed by your health care provider.  Gargle warm saltwater or take cough drops to comfort your throat as directed by your health care provider.  Use a warm mist humidifier or inhale steam from a shower to increase air moisture. This may make it easier to breathe.  Drink enough fluid to keep your urine clear or pale yellow.  Eat soups and other clear broths and maintain good nutrition.  Rest as needed.  Return to work when your temperature has returned to normal  or as your health care provider advises. You may need to stay home longer to avoid infecting others. You can also use a face mask and careful hand washing to prevent spread of the virus.  Increase the usage of your inhaler if you have asthma.  Do not use any tobacco products, including cigarettes, chewing tobacco, or electronic cigarettes. If you need help quitting, ask your health care provider. How is this  prevented? The best way to protect yourself from getting a cold is to practice good hygiene.  Avoid oral or hand contact with people with cold symptoms.  Wash your hands often if contact occurs.  There is no clear evidence that vitamin C, vitamin E, echinacea, or exercise reduces the chance of developing a cold. However, it is always recommended to get plenty of rest, exercise, and practice good nutrition. Contact a health care provider if:  You are getting worse rather than better.  Your symptoms are not controlled by medicine.  You have chills.  You have worsening shortness of breath.  You have brown or red mucus.  You have yellow or brown nasal discharge.  You have pain in your face, especially when you bend forward.  You have a fever.  You have swollen neck glands.  You have pain while swallowing.  You have white areas in the back of your throat. Get help right away if:  You have severe or persistent: ? Headache. ? Ear pain. ? Sinus pain. ? Chest pain.  You have chronic lung disease and any of the following: ? Wheezing. ? Prolonged cough. ? Coughing up blood. ? A change in your usual mucus.  You have a stiff neck.  You have changes in your: ? Vision. ? Hearing. ? Thinking. ? Mood. This information is not intended to replace advice given to you by your health care provider. Make sure you discuss any questions you have with your health care provider. Document Released: 11/26/2000 Document Revised: 02/03/2016 Document Reviewed: 09/07/2013 Elsevier Interactive Patient Education  2017 [employer].

## 2016-12-09 ENCOUNTER — Telehealth: Payer: Self-pay

## 2016-12-09 DIAGNOSIS — J069 Acute upper respiratory infection, unspecified: Secondary | ICD-10-CM

## 2016-12-09 MED ORDER — AZITHROMYCIN 250 MG PO TABS: ORAL_TABLET | ORAL | 0 refills | Status: DC

## 2016-12-09 MED ORDER — HYDROCODONE-HOMATROPINE 5-1.5 MG/5ML PO SYRP
5.0000 mL | ORAL_SOLUTION | Freq: Every evening | ORAL | 0 refills | Status: DC | PRN
Start: 2016-12-09 — End: 2017-04-08

## 2016-12-09 NOTE — Telephone Encounter (Signed)
Noted. Start Azithromycin antibiotics. Take 2 tablets by mouth today, then 1 tablet daily for 4 additional days. I sent this to the pharmacy.

## 2016-12-09 NOTE — Telephone Encounter (Signed)
Spoken and notified patient of [name]'s comments.   Patient asked if Jerome Thomas would give him more Hycodan since it helped his cough.

## 2016-12-09 NOTE — Telephone Encounter (Signed)
Pt seen 12/05/16; pt can not see a lot of improvement; Hycodan helps cough for approx 4 - 6 hours. Pt request abx to [facility].

## 2016-12-09 NOTE — Telephone Encounter (Signed)
Rx printed and ready for pick up at his convenience.

## 2016-12-10 NOTE — Telephone Encounter (Signed)
Spoken to patient and was notified that Rx is left in the front office to pick. Okay for wife to pick up Rx.

## 2017-04-08 ENCOUNTER — Encounter: Payer: Self-pay | Admitting: Primary Care

## 2017-04-08 ENCOUNTER — Ambulatory Visit (INDEPENDENT_AMBULATORY_CARE_PROVIDER_SITE_OTHER): Payer: 59 | Admitting: Primary Care

## 2017-04-08 VITALS — BP 126/74 | HR 77 | Temp 98.0°F | Ht 75.75 in | Wt 350.8 lb

## 2017-04-08 DIAGNOSIS — Z0000 Encounter for general adult medical examination without abnormal findings: Secondary | ICD-10-CM | POA: Insufficient documentation

## 2017-04-08 DIAGNOSIS — Z1211 Encounter for screening for malignant neoplasm of colon: Secondary | ICD-10-CM

## 2017-04-08 DIAGNOSIS — J449 Chronic obstructive pulmonary disease, unspecified: Secondary | ICD-10-CM

## 2017-04-08 DIAGNOSIS — Z125 Encounter for screening for malignant neoplasm of prostate: Secondary | ICD-10-CM | POA: Diagnosis not present

## 2017-04-08 DIAGNOSIS — Z23 Encounter for immunization: Secondary | ICD-10-CM | POA: Diagnosis not present

## 2017-04-08 LAB — LIPID PANEL
CHOL/HDL RATIO: 5
CHOLESTEROL: 211 mg/dL — AB (ref 0–200)
HDL: 39.3 mg/dL (ref >39.00)
NONHDL: 171.27
TRIGLYCERIDES: 253 mg/dL — AB (ref 0.0–149.0)
VLDL: 50.6 mg/dL — ABNORMAL HIGH (ref 0.0–40.0)

## 2017-04-08 LAB — COMPREHENSIVE METABOLIC PANEL
ALBUMIN: 3.9 g/dL (ref 3.5–5.2)
ALT: 37 U/L (ref 0–53)
AST: 16 U/L (ref 0–37)
Alkaline Phosphatase: 98 U/L (ref 39–117)
BILIRUBIN TOTAL: 0.4 mg/dL (ref 0.2–1.2)
BUN: 12 mg/dL (ref 6–23)
CALCIUM: 9.8 mg/dL (ref 8.4–10.5)
CHLORIDE: 104 meq/L (ref 96–112)
CO2: 33 meq/L — AB (ref 19–32)
CREATININE: 0.92 mg/dL (ref 0.40–1.50)
GFR: 92.23 mL/min (ref >60.00)
Glucose, Bld: 102 mg/dL — ABNORMAL HIGH (ref 70–99)
Potassium: 4.4 mEq/L (ref 3.5–5.1)
Sodium: 143 mEq/L (ref 135–145)
Total Protein: 6.5 g/dL (ref 6.0–8.3)

## 2017-04-08 LAB — PSA: PSA: 0.12 ng/mL (ref 0.10–4.00)

## 2017-04-08 LAB — HEMOGLOBIN A1C: Hgb A1c MFr Bld: 6 % (ref 4.6–6.5)

## 2017-04-08 LAB — LDL CHOLESTEROL, DIRECT: LDL DIRECT: 146 mg/dL

## 2017-04-08 NOTE — Assessment & Plan Note (Signed)
No recent use of albuterol inhaler.  Continue to monitor.  

## 2017-04-08 NOTE — Progress Notes (Signed)
Subjective:    Patient ID: Jerome Thomas, male    DOB: 1966/02/12, 51 y.o.   MRN: [id number]  HPI  Jerome Thomas is a 51 year old male who presents today for complete physical.  Immunizations: -Tetanus: Completed in 2012 -Influenza: Due today.   Diet: He endorses a fair diet. Breakfast: Skips Lunch: Restaurants, fast food (pasta, pizza, beef tips) Dinner: Fast food, restaurants, occasionally cooks at home (Praxair, veggies) Snacks: Crackers with cheese, popcorn Desserts: Occasionally  Beverages: [employer], Dr. Malachi Bonds, Sweet tea, water (1-3 bottles daily)  Exercise: He does not exercise Eye exam: Completed several years ago Dental exam: Completes annually Colonoscopy: Due.  PSA: Due.   Review of Systems  Constitutional: Negative for unexpected weight change.  HENT: Negative for rhinorrhea.   Respiratory: Negative for cough and shortness of breath.   Cardiovascular: Negative for chest pain.  Gastrointestinal: Negative for constipation and diarrhea.  Genitourinary: Negative for difficulty urinating.  Musculoskeletal: Negative for arthralgias and myalgias.  Skin: Negative for rash.  Allergic/Immunologic: Negative for environmental allergies.  Neurological: Negative for dizziness, numbness and headaches.  Psychiatric/Behavioral:       Denies anxiety and depression       Past Medical History:  Diagnosis Date  . Avascular necrosis (HCC)    right hip  . COPD (chronic obstructive pulmonary disease) ([place])   . Genital warts      Social History   Social History  . Marital status: Married    Spouse name: N/A  . Number of children: N/A  . Years of education: N/A   Occupational History  . 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 Smoking status: Current Every Day Smoker    Packs/day: 1.00    Years: 30.00    Types: Cigarettes  . Smokeless tobacco: Never Used  . Alcohol use Yes     Comment: occasional  . Drug use: No  .  Sexual activity: Yes    Partners: Female   Other Topics Concern  . Not on file   Social History Narrative   Married.   1 child.    Works as a Dealer.   Enjoys riding 4-wheelers.     Past Surgical History:  Procedure Laterality Date  . ARTHROSCOPY KNEE W/ DRILLING  1980's   left knee  . HERNIA REPAIR  yrs ago   x 2  . TOTAL HIP ARTHROPLASTY  05/30/2011   Procedure: TOTAL HIP ARTHROPLASTY ANTERIOR APPROACH;  Surgeon: Mcarthur Rossetti;  Location: [facility];  Service: Orthopedics;  Laterality: Right;  Right Total Hip Arthroplasty, Direct Anterior Approach (C-Arm)    Family History  Problem Relation Age of Onset  . Arthritis Father     No Known Allergies  Current Outpatient Prescriptions on File Prior to Visit  Medication Sig Dispense Refill  . albuterol (PROAIR HFA) 108 (90 BASE) MCG/ACT inhaler Inhale 2 puffs into the lungs every 6 (six) hours as needed for wheezing. (Patient not taking: Reported on 04/08/2017) 1 Inhaler 2   No current facility-administered medications on file prior to visit.     BP 126/74   Pulse 77   Temp 98 F (36.7 C) (Oral)   Ht 6' 3.75" (1.924 m)   Wt (!) 350 lb 12.8 oz (159.1 kg)   SpO2 93%   BMI 42.98 kg/m    Objective:   Physical Exam  Constitutional: He is oriented to person, place, and time. He appears well-nourished.  HENT:  Right Ear: Tympanic membrane and ear canal normal.  Left Ear: Tympanic membrane and ear canal normal.  Nose: Nose normal. Right sinus exhibits no maxillary sinus tenderness and no frontal sinus tenderness. Left sinus exhibits no maxillary sinus tenderness and no frontal sinus tenderness.  Mouth/Throat: Oropharynx is clear and moist.  Eyes: Pupils are equal, round, and reactive to light. Conjunctivae and EOM are normal.  Neck: Neck supple. Carotid bruit is not present. No thyromegaly present.  Cardiovascular: Normal rate, regular rhythm and normal heart sounds.   Pulmonary/Chest: Effort normal and breath  sounds normal. He has no wheezes. He has no rales.  Abdominal: Soft. Bowel sounds are normal. There is no tenderness.  Musculoskeletal: Normal range of motion.  Neurological: He is alert and oriented to person, place, and time. He has normal reflexes. No cranial nerve deficit.  Skin: Skin is warm and dry.  Psychiatric: He has a normal mood and affect.          Assessment & Plan:

## 2017-04-08 NOTE — Patient Instructions (Signed)
Complete lab work prior to leaving today. I will notify you of your results once received.   You will be contacted regarding your referral to GI.  Please let us know if you have not heard back within one week.   Start exercising. You should be getting 150 minutes of moderate intensity exercise weekly.  It's important to improve your diet by reducing consumption of fast food, fried food, processed snack foods, sugary drinks. Increase consumption of fresh vegetables and fruits, whole grains, water.  Ensure you are drinking 64 ounces of water daily.  Follow up in 1 year for your annual exam or sooner if needed.  It was a pleasure to see you today!

## 2017-04-08 NOTE — Addendum Note (Signed)
Addended by: Jacqualin Combes on: 04/08/2017 06:40 PM   Modules accepted: Orders

## 2017-04-08 NOTE — Assessment & Plan Note (Signed)
Immunizations UTD. Influenza vaccination provided today. PSA due, pending. Colonoscopy due, pending. He plans on scheduling after the new year in 2019. Discussed the importance of a healthy diet and regular exercise in order for weight loss, and to reduce the risk of other medical problems. Exam unremarkable. Labs pending. Follow up in 1 year.

## 2017-04-13 ENCOUNTER — Encounter: Payer: Self-pay | Admitting: Gastroenterology

## 2017-04-17 ENCOUNTER — Encounter: Payer: 59 | Admitting: Primary Care

## 2017-06-19 ENCOUNTER — Other Ambulatory Visit: Payer: Self-pay

## 2017-06-19 ENCOUNTER — Ambulatory Visit (AMBULATORY_SURGERY_CENTER): Payer: Self-pay

## 2017-06-19 VITALS — Ht 76.0 in | Wt 329.0 lb

## 2017-06-19 DIAGNOSIS — Z1211 Encounter for screening for malignant neoplasm of colon: Secondary | ICD-10-CM

## 2017-06-19 MED ORDER — PEG-KCL-NACL-NASULF-NA ASC-C 140 G PO SOLR: 1.0000 | Freq: Once | ORAL | 0 refills | Status: AC

## 2017-06-19 NOTE — Progress Notes (Signed)
Denies allergies to eggs or soy products. Denies complication of anesthesia or sedation. Denies use of weight loss medication. Denies use of O2.   Emmi instructions declined.  

## 2017-06-23 ENCOUNTER — Telehealth: Payer: Self-pay

## 2017-06-23 NOTE — Telephone Encounter (Signed)
[facility] 1 tab every 6 hours as needed. #135. Upcoming colonoscopy scheduled for 07-03-2017

## 2017-06-23 NOTE — Telephone Encounter (Signed)
There are no Epic encounters with [facility] for this patient, so if that Rx came from [place], it was at least a decade ago. He will have to get a refill from primary care or wait to see me for his colonoscopy, giving me a chance to talk to him.

## 2017-06-24 NOTE — Telephone Encounter (Signed)
Pt contacted. He is unaware of this medication and did not request it.

## 2017-06-26 ENCOUNTER — Encounter: Payer: Self-pay | Admitting: Gastroenterology

## 2017-07-03 ENCOUNTER — Other Ambulatory Visit: Payer: Self-pay

## 2017-07-03 ENCOUNTER — Ambulatory Visit (AMBULATORY_SURGERY_CENTER): Payer: 59 | Admitting: Gastroenterology

## 2017-07-03 ENCOUNTER — Encounter: Payer: Self-pay | Admitting: Gastroenterology

## 2017-07-03 VITALS — BP 108/72 | HR 66 | Temp 99.5°F | Resp 18 | Ht 76.0 in | Wt 329.0 lb

## 2017-07-03 DIAGNOSIS — Z1211 Encounter for screening for malignant neoplasm of colon: Secondary | ICD-10-CM | POA: Diagnosis not present

## 2017-07-03 DIAGNOSIS — D122 Benign neoplasm of ascending colon: Secondary | ICD-10-CM | POA: Diagnosis not present

## 2017-07-03 DIAGNOSIS — D125 Benign neoplasm of sigmoid colon: Secondary | ICD-10-CM

## 2017-07-03 DIAGNOSIS — Z1212 Encounter for screening for malignant neoplasm of rectum: Secondary | ICD-10-CM

## 2017-07-03 MED ORDER — SODIUM CHLORIDE 0.9 % IV SOLN: 500.0000 mL | Freq: Once | INTRAVENOUS | Status: DC

## 2017-07-03 NOTE — Progress Notes (Signed)
Pt's states no medical or surgical changes since previsit or office visit. maw 

## 2017-07-03 NOTE — Op Note (Signed)
Jerome Thomas Patient Name: Jerome Thomas Procedure Date: 07/03/2017 9:16 AM MRN: [id number] Endoscopist: Ward. Loletha Carrow , MD Age: 52 Referring MD:  Date of Birth: [birth year] Gender: Male Account #: [id number] Procedure:                Colonoscopy Indications:              Screening for colorectal malignant neoplasm, This                            is the patient's first colonoscopy Medicines:                Monitored Anesthesia Care Procedure:                Pre-Anesthesia Assessment:                           - Prior to the procedure, a History and Physical                            was performed, and patient medications and                            allergies were reviewed. The patient's tolerance of                            previous anesthesia was also reviewed. The risks                            and benefits of the procedure and the sedation                            options and risks were discussed with the patient.                            All questions were answered, and informed consent                            was obtained. Prior Anticoagulants: The patient has                            taken no previous anticoagulant or antiplatelet                            agents. ASA Grade Assessment: II - A patient with                            mild systemic disease. After reviewing the risks                            and benefits, the patient was deemed in                            satisfactory condition to undergo the procedure.  After obtaining informed consent, the colonoscope                            was passed under direct vision. Throughout the                            procedure, the patient's blood pressure, pulse, and                            oxygen saturations were monitored continuously. The                            Colonoscope was introduced through the anus and                            advanced to the the cecum,  identified by                            appendiceal orifice and ileocecal valve. The                            colonoscopy was performed with moderate difficulty                            due to significant looping. Successful completion                            of the procedure was aided by using manual                            pressure. The patient tolerated the procedure well.                            The quality of the bowel preparation was excellent.                            The ileocecal valve, appendiceal orifice, and                            rectum were photographed. The quality of the bowel                            preparation was evaluated using the BBPS [facility]                            Bowel Preparation Scale) with scores of: Right                            Colon = 3, Transverse Colon = 3 and Left Colon = 3                            (entire mucosa seen well with no residual staining,  small fragments of stool or opaque liquid). The                            total BBPS score equals 9. Scope In: 9:24:21 AM Scope Out: 9:40:56 AM Scope Withdrawal Time: 0 hours 12 minutes 9 seconds  Total Procedure Duration: 0 hours 16 minutes 35 seconds  Findings:                 The perianal and digital rectal examinations were                            normal.                           Two sessile polyps were found in the proximal                            ascending colon. The polyps were 2 to 4 mm in size.                            These polyps were removed with a cold snare.                            Resection and retrieval were complete.                           A 6-8 mm polyp was found in the sigmoid colon. The                            polyp was pedunculated. The polyp was removed with                            a hot snare. Resection and retrieval were complete.                           The exam was otherwise without abnormality on                             direct and retroflexion views. Complications:            No immediate complications. Estimated Blood Loss:     Estimated blood loss was minimal. Impression:               - Two 2 to 4 mm polyps in the proximal ascending                            colon, removed with a cold snare. Resected and                            retrieved.                           - One 6-8 mm polyp in the sigmoid colon, removed  with a hot snare. Resected and retrieved.                           - The examination was otherwise normal on direct                            and retroflexion views. Recommendation:           - Patient has a contact number available for                            emergencies. The signs and symptoms of potential                            delayed complications were discussed with the                            patient. Return to normal activities tomorrow.                            Written discharge instructions were provided to the                            patient.                           - Resume previous diet.                           - Continue present medications.                           - Await pathology results.                           - Repeat colonoscopy is recommended for                            surveillance. The colonoscopy date will be                            determined after pathology results from today's                            exam become available for review. Daishaun Ayre L. Loletha Carrow, MD 07/03/2017 9:47:38 AM This report has been signed electronically.

## 2017-07-03 NOTE — Patient Instructions (Signed)
YOU HAD AN ENDOSCOPIC PROCEDURE TODAY AT [facility]:   Refer to the procedure report that was given to you for any specific questions about what was found during the examination.  If the procedure report does not answer your questions, please call your gastroenterologist to clarify.  If you requested that your care partner not be given the details of your procedure findings, then the procedure report has been included in a sealed envelope for you to review at your convenience later.  YOU SHOULD EXPECT: Some feelings of bloating in the abdomen. Passage of more gas than usual.  Walking can help get rid of the air that was put into your GI tract during the procedure and reduce the bloating. If you had a lower endoscopy (such as a colonoscopy or flexible sigmoidoscopy) you may notice spotting of blood in your stool or on the toilet paper. If you underwent a bowel prep for your procedure, you may not have a normal bowel movement for a few days.  Please Note:  You might notice some irritation and congestion in your nose or some drainage.  This is from the oxygen used during your procedure.  There is no need for concern and it should clear up in a day or so.  SYMPTOMS TO REPORT IMMEDIATELY:   Following lower endoscopy (colonoscopy or flexible sigmoidoscopy):  Excessive amounts of blood in the stool  Significant tenderness or worsening of abdominal pains  Swelling of the abdomen that is new, acute  Fever of 100F or higher  For urgent or emergent issues, a gastroenterologist can be reached at any hour by calling [phone].   DIET:  We do recommend a small meal at first, but then you may proceed to your regular diet.  Drink plenty of fluids but you should avoid alcoholic beverages for 24 hours.  ACTIVITY:  You should plan to take it easy for the rest of today and you should NOT DRIVE or use heavy machinery until tomorrow (because of the sedation medicines used during the test).     FOLLOW UP: Our staff will call the number listed on your records the next business day following your procedure to check on you and address any questions or concerns that you may have regarding the information given to you following your procedure. If we do not reach you, we will leave a message.  However, if you are feeling well and you are not experiencing any problems, there is no need to return our call.  We will assume that you have returned to your regular daily activities without incident.  If any biopsies were taken you will be contacted by phone or by letter within the next 1-3 weeks.  Please call us at [phone] if you have not heard about the biopsies in 3 weeks.   SIGNATURES/CONFIDENTIALITY: You and/or your care partner have signed paperwork which will be entered into your electronic medical record.  These signatures attest to the fact that that the information above on your After Visit Summary has been reviewed and is understood.  Full responsibility of the confidentiality of this discharge information lies with you and/or your care-partner.  Await pathology  Please read over handout about polyps  Please continue your normal medications

## 2017-07-03 NOTE — Progress Notes (Signed)
Called to room to assist during endoscopic procedure.  Patient ID and intended procedure confirmed with present staff. Received instructions for my participation in the procedure from the performing physician.  

## 2017-07-03 NOTE — Progress Notes (Signed)
A and O x3. Report to RN. Tolerated MAC anesthesia well.

## 2017-07-06 ENCOUNTER — Telehealth: Payer: Self-pay

## 2017-07-06 NOTE — Telephone Encounter (Signed)
  Follow up Call-  Call back number 07/03/2017  Post procedure Call Back phone  # [phone] cell  Permission to leave phone message Yes  Some recent data might be hidden     Patient questions:  Do you have a fever, pain , or abdominal swelling? No. Pain Score  0 *  Have you tolerated food without any problems? Yes.    Have you been able to return to your normal activities? Yes.    Do you have any questions about your discharge instructions: Diet   No. Medications  No. Follow up visit  No.  Do you have questions or concerns about your Care? No.  Actions: * If pain score is 4 or above: No action needed, pain <4.

## 2017-07-06 NOTE — Telephone Encounter (Signed)
  Follow up Call-  Call back number 07/03/2017  Post procedure Call Back phone  # [phone] cell  Permission to leave phone message Yes  Some recent data might be hidden     [facility] Angela/Recovery Room

## 2017-07-07 ENCOUNTER — Encounter: Payer: Self-pay | Admitting: Gastroenterology

## 2018-05-18 ENCOUNTER — Other Ambulatory Visit: Payer: Self-pay | Admitting: Primary Care

## 2018-05-18 DIAGNOSIS — E785 Hyperlipidemia, unspecified: Secondary | ICD-10-CM

## 2018-05-18 DIAGNOSIS — R7303 Prediabetes: Secondary | ICD-10-CM

## 2018-05-28 ENCOUNTER — Other Ambulatory Visit (INDEPENDENT_AMBULATORY_CARE_PROVIDER_SITE_OTHER): Payer: 59

## 2018-05-28 DIAGNOSIS — R7303 Prediabetes: Secondary | ICD-10-CM

## 2018-05-28 DIAGNOSIS — E785 Hyperlipidemia, unspecified: Secondary | ICD-10-CM

## 2018-05-28 LAB — COMPREHENSIVE METABOLIC PANEL
ALT: 28 U/L (ref 0–53)
AST: 16 U/L (ref 0–37)
Albumin: 4.1 g/dL (ref 3.5–5.2)
Alkaline Phosphatase: 82 U/L (ref 39–117)
BUN: 14 mg/dL (ref 6–23)
CO2: 29 meq/L (ref 19–32)
Calcium: 9.7 mg/dL (ref 8.4–10.5)
Chloride: 103 mEq/L (ref 96–112)
Creatinine, Ser: 0.87 mg/dL (ref 0.40–1.50)
GFR: 97.93 mL/min (ref >60.00)
GLUCOSE: 118 mg/dL — AB (ref 70–99)
Potassium: 4.2 mEq/L (ref 3.5–5.1)
SODIUM: 140 meq/L (ref 135–145)
Total Bilirubin: 0.5 mg/dL (ref 0.2–1.2)
Total Protein: 6.8 g/dL (ref 6.0–8.3)

## 2018-05-28 LAB — LIPID PANEL
CHOL/HDL RATIO: 5
Cholesterol: 230 mg/dL — ABNORMAL HIGH (ref 0–200)
HDL: 43.8 mg/dL (ref >39.00)
LDL Cholesterol: 157 mg/dL — ABNORMAL HIGH (ref 0–99)
NONHDL: 186.66
Triglycerides: 149 mg/dL (ref 0.0–149.0)
VLDL: 29.8 mg/dL (ref 0.0–40.0)

## 2018-05-28 LAB — HEMOGLOBIN A1C: HEMOGLOBIN A1C: 5.9 % (ref 4.6–6.5)

## 2018-06-02 ENCOUNTER — Encounter: Payer: Self-pay | Admitting: Primary Care

## 2018-06-02 ENCOUNTER — Ambulatory Visit (INDEPENDENT_AMBULATORY_CARE_PROVIDER_SITE_OTHER): Payer: 59 | Admitting: Primary Care

## 2018-06-02 VITALS — BP 120/74 | HR 69 | Temp 98.1°F | Ht 76.0 in | Wt 327.5 lb

## 2018-06-02 DIAGNOSIS — J449 Chronic obstructive pulmonary disease, unspecified: Secondary | ICD-10-CM

## 2018-06-02 DIAGNOSIS — Z23 Encounter for immunization: Secondary | ICD-10-CM

## 2018-06-02 DIAGNOSIS — Z0000 Encounter for general adult medical examination without abnormal findings: Secondary | ICD-10-CM

## 2018-06-02 DIAGNOSIS — E785 Hyperlipidemia, unspecified: Secondary | ICD-10-CM

## 2018-06-02 MED ORDER — ROSUVASTATIN CALCIUM 5 MG PO TABS: 5.0000 mg | ORAL_TABLET | Freq: Every day | ORAL | 3 refills | Status: DC

## 2018-06-02 NOTE — Assessment & Plan Note (Signed)
Tetanus UTD. Influenza and pneumonia vaccinations provided today. PSA UTD. Colonoscopy UTD. Discussed the importance of a healthy diet and regular exercise in order for weight loss, and to reduce the risk of any potential medical problems. Exam stable. Labs reviewed. Follow up in 1 year for CPE.

## 2018-06-02 NOTE — Addendum Note (Signed)
Addended by: Jacqualin Combes on: 06/02/2018 10:39 AM   Modules accepted: Orders

## 2018-06-02 NOTE — Assessment & Plan Note (Signed)
LDL of 157. Current smoker, also with prediabetes. ASCVD risk score of 10%. Discussion about prevention of CVD, he agrees to start low dose statin therapy. Rx for Crestor 5 mg sent to pharmacy. Repeat lipids and LFT's in 6 weeks.

## 2018-06-02 NOTE — Patient Instructions (Signed)
Start rosuvastatin 5 mg tablets daily for cholesterol. Take 1 tablet by mouth once daily.   Start exercising. You should be getting 150 minutes of moderate intensity exercise weekly.  It's important to improve your diet by reducing consumption of fast food, fried food, processed snack foods, sugary drinks. Increase consumption of fresh vegetables and fruits, whole grains, water.  Ensure you are drinking 64 ounces of water daily.  Schedule a lab only appointment for 6 weeks to repeat your cholesterol.  It was a pleasure to see you today!   Preventive Care 40-64 Years, Male Preventive care refers to lifestyle choices and visits with your health care provider that can promote health and wellness. What does preventive care include?   A yearly physical exam. This is also called an annual well check.  Dental exams once or twice a year.  Routine eye exams. Ask your health care provider how often you should have your eyes checked.  Personal lifestyle choices, including: ? Daily care of your teeth and gums. ? Regular physical activity. ? Eating a healthy diet. ? Avoiding tobacco and drug use. ? Limiting alcohol use. ? Practicing safe sex. ? Taking low-dose aspirin every day starting at age 50. What happens during an annual well check? The services and screenings done by your health care provider during your annual well check will depend on your age, overall health, lifestyle risk factors, and family history of disease. Counseling Your health care provider may ask you questions about your:  Alcohol use.  Tobacco use.  Drug use.  Emotional well-being.  Home and relationship well-being.  Sexual activity.  Eating habits.  Work and work Statistician. Screening You may have the following tests or measurements:  Height, weight, and BMI.  Blood pressure.  Lipid and cholesterol levels. These may be checked every 5 years, or more frequently if you are over 4 years old.  Skin  check.  Lung cancer screening. You may have this screening every year starting at age 35 if you have a 30-pack-year history of smoking and currently smoke or have quit within the past 15 years.  Colorectal cancer screening. All adults should have this screening starting at age 29 and continuing until age 49. Your health care provider may recommend screening at age 32. You will have tests every 1-10 years, depending on your results and the type of screening test. People at increased risk should start screening at an earlier age. Screening tests may include: ? Guaiac-based fecal occult blood testing. ? Fecal immunochemical test (FIT). ? Stool DNA test. ? Virtual colonoscopy. ? Sigmoidoscopy. During this test, a flexible tube with a tiny camera (sigmoidoscope) is used to examine your rectum and lower colon. The sigmoidoscope is inserted through your anus into your rectum and lower colon. ? Colonoscopy. During this test, a long, thin, flexible tube with a tiny camera (colonoscope) is used to examine your entire colon and rectum.  Prostate cancer screening. Recommendations will vary depending on your family history and other risks.  Hepatitis C blood test.  Hepatitis B blood test.  Sexually transmitted disease (STD) testing.  Diabetes screening. This is done by checking your blood sugar (glucose) after you have not eaten for a while (fasting). You may have this done every 1-3 years. Discuss your test results, treatment options, and if necessary, the need for more tests with your health care provider. Vaccines Your health care provider may recommend certain vaccines, such as:  Influenza vaccine. This is recommended every year.  Tetanus, diphtheria,  and acellular pertussis (Tdap, Td) vaccine. You may need a Td booster every 10 years.  Varicella vaccine. You may need this if you have not been vaccinated.  Zoster vaccine. You may need this after age 62.  Measles, mumps, and rubella (MMR)  vaccine. You may need at least one dose of MMR if you were born in 1957 or later. You may also need a second dose.  Pneumococcal 13-valent conjugate (PCV13) vaccine. You may need this if you have certain conditions and have not been vaccinated.  Pneumococcal polysaccharide (PPSV23) vaccine. You may need one or two doses if you smoke cigarettes or if you have certain conditions.  Meningococcal vaccine. You may need this if you have certain conditions.  Hepatitis A vaccine. You may need this if you have certain conditions or if you travel or work in places where you may be exposed to hepatitis A.  Hepatitis B vaccine. You may need this if you have certain conditions or if you travel or work in places where you may be exposed to hepatitis B.  Haemophilus influenzae type b (Hib) vaccine. You may need this if you have certain risk factors. Talk to your health care provider about which screenings and vaccines you need and how often you need them. This information is not intended to replace advice given to you by your health care provider. Make sure you discuss any questions you have with your health care provider. Document Released: 06/29/2015 Document Revised: 07/23/2017 Document Reviewed: 04/03/2015 Elsevier Interactive Patient Education  2019 [employer].

## 2018-06-02 NOTE — Assessment & Plan Note (Signed)
Exam with clear lungs. Discussed importance of tobacco cessation. Pneumonia vaccination provided today.

## 2018-06-02 NOTE — Progress Notes (Signed)
Subjective:    Patient ID: Jerome Thomas, male    DOB: 05/08/1966, 52 y.o.   MRN: [id number]  HPI  Jerome Thomas is a 52 year old male who presents today for complete physical.  Immunizations: -Tetanus: Completed in 2012 -Influenza: Due today -Pneumonia: Never completed   Diet: He endorses a fair diet Breakfast: Peanut butter crackers Lunch: Restaurants Dinner: Meat, vegetables, starch Snacks: None Desserts: None Beverages: Soda, water, little sweet tea  Exercise: He is not exercising Eye exam: No recent exam Dental exam: Completes semi-annually  Colonoscopy: Completed in 2019, due in 3 years PSA: 0.12 in 2018  Wt Readings from Last 3 Encounters:  06/02/18 (!) 327 lb 8 oz (148.6 kg)  07/03/17 (!) 329 lb (149.2 kg)  06/19/17 (!) 329 lb (149.2 kg)   The 10-year ASCVD risk score Mikey Bussing DC Jr., et al., 2013) is: 10.5%   Values used to calculate the score:     Age: 52 years     Sex: Male     Is Non-Hispanic African American: No     Diabetic: No     Tobacco smoker: Yes     Systolic Blood Pressure: 993 mmHg     Is BP treated: No     HDL Cholesterol: 43.8 mg/dL     Total Cholesterol: 230 mg/dL   Review of Systems  Constitutional: Negative for unexpected weight change.  HENT: Negative for rhinorrhea.   Respiratory: Negative for cough and shortness of breath.   Cardiovascular: Negative for chest pain.  Gastrointestinal: Negative for constipation and diarrhea.  Genitourinary: Negative for difficulty urinating.  Musculoskeletal: Negative for arthralgias.  Skin: Negative for rash.  Allergic/Immunologic: Negative for environmental allergies.  Neurological: Negative for dizziness, numbness and headaches.  Psychiatric/Behavioral: The patient is not nervous/anxious.        Past Medical History:  Diagnosis Date  . Allergy   . Avascular necrosis (HCC)    right hip  . COPD (chronic obstructive pulmonary disease) ([place])   . Genital warts      Social History    Socioeconomic History  . Marital status: Married    Spouse name: Not on file  . Number of children: Not on file  . Years of education: Not on file  . Highest education level: Not on file  Occupational History  . Occupation: Psychiatrist: [employer]  . Financial resource strain: Not on file  . Food insecurity:    Worry: Not on file    Inability: Not on file  . Transportation needs:    Medical: Not on file    Non-medical: Not on file  Tobacco Use  . Smoking status: Current Every Day Smoker    Packs/day: 1.00    Years: 30.00    Pack years: 30.00    Types: Cigarettes  . Smokeless tobacco: Never Used  Substance and Sexual Activity  . Alcohol use: Yes    Comment: occasional  . Drug use: No  . Sexual activity: Yes    Partners: Female  Lifestyle  . Physical activity:    Days per week: Not on file    Minutes per session: Not on file  . Stress: Not on file  Relationships  . Social connections:    Talks on phone: Not on file    Gets together: Not on file    Attends religious service: Not on file    Active member of club or organization: Not on file  Attends meetings of clubs or organizations: Not on file    Relationship status: Not on file  . Intimate partner violence:    Fear of current or ex partner: Not on file    Emotionally abused: Not on file    Physically abused: Not on file    Forced sexual activity: Not on file  Other Topics Concern  . Not on file  Social History Narrative   Married.   1 child.    Works as a Dealer.   Enjoys riding 4-wheelers.     Past Surgical History:  Procedure Laterality Date  . ARTHROSCOPY KNEE W/ DRILLING  1980's   left knee  . HERNIA REPAIR  yrs ago   x 2  . TOTAL HIP ARTHROPLASTY  05/30/2011   Procedure: TOTAL HIP ARTHROPLASTY ANTERIOR APPROACH;  Surgeon: Mcarthur Rossetti;  Location: [facility];  Service: Orthopedics;  Laterality: Right;  Right Total Hip Arthroplasty, Direct  Anterior Approach (C-Arm)    Family History  Problem Relation Age of Onset  . Arthritis Father   . Colon cancer Neg Hx   . Esophageal cancer Neg Hx   . Liver cancer Neg Hx   . Pancreatic cancer Neg Hx   . Rectal cancer Neg Hx   . Stomach cancer Neg Hx   . Prostate cancer Neg Hx     No Known Allergies  Current Outpatient Medications on File Prior to Visit  Medication Sig Dispense Refill  . Omega-3 Fatty Acids (FISH OIL) 1000 MG CAPS Take by mouth. Take 2,000 daily     Current Facility-Administered Medications on File Prior to Visit  Medication Dose Route Frequency Provider Last Rate Last Dose  . 0.9 %  sodium chloride infusion  500 mL Intravenous Once Danis, Estill Cotta III, MD        BP 120/74   Pulse 69   Temp 98.1 F (36.7 C) (Oral)   Ht 6\' 4"  (1.93 m)   Wt (!) 327 lb 8 oz (148.6 kg)   SpO2 99%   BMI 39.86 kg/m    Objective:   Physical Exam  Constitutional: He is oriented to person, place, and time. He appears well-nourished.  HENT:  Mouth/Throat: No oropharyngeal exudate.  Eyes: Pupils are equal, round, and reactive to light. EOM are normal.  Neck: Neck supple. No thyromegaly present.  Cardiovascular: Normal rate and regular rhythm.  Respiratory: Effort normal and breath sounds normal.  GI: Soft. Bowel sounds are normal. There is no abdominal tenderness.  Musculoskeletal: Normal range of motion.  Neurological: He is alert and oriented to person, place, and time.  Skin: Skin is warm and dry.  Psychiatric: He has a normal mood and affect.           Assessment & Plan:

## 2018-07-14 ENCOUNTER — Other Ambulatory Visit (INDEPENDENT_AMBULATORY_CARE_PROVIDER_SITE_OTHER): Payer: 59

## 2018-07-14 DIAGNOSIS — E785 Hyperlipidemia, unspecified: Secondary | ICD-10-CM | POA: Diagnosis not present

## 2018-07-15 LAB — HEPATIC FUNCTION PANEL
ALT: 52 U/L (ref 0–53)
AST: 24 U/L (ref 0–37)
Albumin: 4.1 g/dL (ref 3.5–5.2)
Alkaline Phosphatase: 88 U/L (ref 39–117)
Bilirubin, Direct: 0.1 mg/dL (ref 0.0–0.3)
Total Bilirubin: 0.5 mg/dL (ref 0.2–1.2)
Total Protein: 6.9 g/dL (ref 6.0–8.3)

## 2018-07-15 LAB — LIPID PANEL
CHOLESTEROL: 172 mg/dL (ref 0–200)
HDL: 36.9 mg/dL — ABNORMAL LOW (ref >39.00)
NonHDL: 134.66
TRIGLYCERIDES: 306 mg/dL — AB (ref 0.0–149.0)
Total CHOL/HDL Ratio: 5
VLDL: 61.2 mg/dL — ABNORMAL HIGH (ref 0.0–40.0)

## 2018-07-15 LAB — LDL CHOLESTEROL, DIRECT: Direct LDL: 108 mg/dL

## 2018-07-28 ENCOUNTER — Telehealth: Payer: Self-pay

## 2018-07-28 NOTE — Telephone Encounter (Signed)
Spoken and notified patient's wife of Tawni Millers comments. Patient's wife verbalized understanding. She will have patient call office to schedule appointment

## 2018-07-28 NOTE — Telephone Encounter (Signed)
Jerome Thomas (DPR signed)left v/m; pt last seen for annual on 06/02/18 and started Crestor 5 mg; pt is now losing hair in splotches and wants to know if could be related to Crestor or what to do. Jerome Thomas request cb.

## 2018-07-28 NOTE — Telephone Encounter (Signed)
Hair loss is not a listed side effect of Crestor.  I'm happy to evaluate him in the office for his symptoms at his convenience.

## 2018-08-04 ENCOUNTER — Ambulatory Visit: Payer: Self-pay | Admitting: Primary Care

## 2018-08-04 ENCOUNTER — Encounter: Payer: Self-pay | Admitting: Primary Care

## 2018-08-04 VITALS — BP 122/68 | HR 80 | Temp 98.0°F | Ht 76.0 in | Wt 327.5 lb

## 2018-08-04 DIAGNOSIS — L639 Alopecia areata, unspecified: Secondary | ICD-10-CM | POA: Insufficient documentation

## 2018-08-04 MED ORDER — BETAMETHASONE DIPROPIONATE 0.05 % EX CREA: TOPICAL_CREAM | Freq: Two times a day (BID) | CUTANEOUS | 0 refills | Status: DC

## 2018-08-04 NOTE — Patient Instructions (Addendum)
Apply the topical cream twice daily for 2-4 weeks.  You will be contacted regarding your referral to Dermatology.  Please let us know if you have not been contacted within one week. You may cancel this if you notice improvement.  It was a pleasure to see you today!    Alopecia Areata, Adult  Alopecia areata is a condition that causes you to lose hair. You may lose hair on your scalp in patches. In some cases, you may lose all the hair on your scalp (alopecia totalis) or all the hair from your face and body (alopecia universalis). Alopecia areata is an autoimmune disease. This means that your body's defense system (immune system) mistakes normal parts of the body for germs or other things that can make you sick. When you have alopecia areata, the immune system attacks the hair follicles. Alopecia areata usually develops in childhood, but it can develop at any age. For some people, their hair grows back on its own and hair loss does not happen again. For others, their hair may fall out and grow back in cycles. The hair loss may last many years. Having this condition can be emotionally difficult, but it is not dangerous. What are the causes? The cause of this condition is not known. What increases the risk? This condition is more likely to develop in people who have:  A family history of alopecia.  A family history of another autoimmune disease, including type 1 diabetes and rheumatoid arthritis.  Asthma and allergies.  Down syndrome. What are the signs or symptoms? Round spots of patchy hair loss on the scalp is the main symptom of this condition. The spots may be mildly itchy. Other symptoms include:  Short dark hairs in the bald patches that are wider at the top (exclamation point hairs).  Dents, white spots, or lines in the fingernails or toenails.  Balding and body hair loss. This is rare. How is this diagnosed? This condition is diagnosed based on your symptoms and family  history. Your health care provider will also check your scalp skin, teeth, and nails. Your health care provider may refer you to a specialist in hair and skin disorders (dermatologist). You may also have tests, including:  A hair pull test.  Blood tests or other screening tests to check for autoimmune diseases, such as thyroid disease or diabetes.  Skin biopsy to confirm the diagnosis.  A procedure to examine the skin with a lighted magnifying instrument (dermoscopy). How is this treated? There is no cure for alopecia areata. Treatment is aimed at promoting the regrowth of hair and preventing the immune system from overreacting. No single treatment is right for all people with alopecia areata. It depends on the type of hair loss you have and how severe it is. Work with your health care provider to find the best treatment for you. Treatment may include:  Having regular checkups to make sure the condition is not getting worse (watchful waiting).  Steroid creams or pills for 6-8 weeks to stop the immune reaction and help hair to regrow more quickly.  Other topical medicines to alter the immune system response and support the hair growth cycle.  Steroid injections.  Therapy and counseling with a support group or therapist if you are having trouble coping with hair loss. Follow these instructions at home:  Learn as much as you can about your condition.  Apply topical creams only as told by your health care provider.  Take over-the-counter and prescription medicines only as  told by your health care provider.  Consider getting a wig or products to make hair look fuller or to cover bald spots, if you feel uncomfortable with your appearance.  Get therapy or counseling if you are having a hard time coping with hair loss. Ask your health care provider to recommend a counselor or support group.  Keep all follow-up visits as told by your health care provider. This is important. Contact a health  care provider if:  Your hair loss gets worse, even with treatment.  You have new symptoms.  You are struggling emotionally. Summary  Alopecia areata is an autoimmune condition that makes your body's defense system (immune system) attack the hair follicles. This causes you to lose hair.  Treatments may include regular checkups to make sure that the condition is not getting worse (watchful waiting), medicines, and steroid injections. This information is not intended to replace advice given to you by your health care provider. Make sure you discuss any questions you have with your health care provider. Document Released: 01/05/2004 Document Revised: 06/20/2016 Document Reviewed: 06/20/2016 Elsevier Interactive Patient Education  2019 [employer].

## 2018-08-04 NOTE — Assessment & Plan Note (Signed)
Do not suspect hair loss is secondary to rosuvastatin.  We will have him resume. Exam today very representative of alopecia areata, evidence of hair follicle growth without evidence of tinea or other rash. Treat with betamethasone cream twice daily x2 to 4 weeks. Referral placed to dermatology for him to use if needed if no improvement in symptoms.

## 2018-08-04 NOTE — Progress Notes (Signed)
Subjective:    Patient ID: Jerome Thomas, male    DOB: 1965-07-12, 53 y.o.   MRN: [id number]  HPI  Jerome Thomas is a 53 year old male who presents today with a chief complaint of alopecia.  He phoned into our office on 07/28/2018 with complaints of hair loss in patches.  He thought this may be secondary to the rosuvastatin that was initiated in mid December 2019.  Today he endorses noticing patches of hair loss with hair thinning that began around Christmas or shortly after.  He started his rosuvastatin in mid December 2019 after his visit.  He will notice hair within the shampoo in his hand while showering and hair on the pillow when waking in the morning.  He wears a hat throughout his workday.  He washes his hair daily and has had no changes in shampoo or soaps.  He denies itching, rashes.  He stopped his rosuvastatin 5 days ago and has not noticed a difference.  He has not tried anything over-the-counter for symptoms.  Review of Systems  Musculoskeletal: Negative for myalgias.  Skin: Negative for color change and rash.       Hair loss  Hematological: Negative for adenopathy.       Past Medical History:  Diagnosis Date  . Allergy   . Avascular necrosis (HCC)    right hip  . COPD (chronic obstructive pulmonary disease) ([place])   . Genital warts      Social History   Socioeconomic History  . Marital status: Married    Spouse name: Not on file  . Number of children: Not on file  . Years of education: Not on file  . Highest education level: Not on file  Occupational History  . 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 Financial resource strain: Not on file  . Food insecurity:    Worry: Not on file    Inability: Not on file  . Transportation needs:    Medical: Not on file    Non-medical: Not on file  Tobacco Use  . Smoking status: Current Every Day Smoker    Packs/day: 1.00    Years: 30.00    Pack years: 30.00    Types:  Cigarettes  . Smokeless tobacco: Never Used  Substance and Sexual Activity  . Alcohol use: Yes    Comment: occasional  . Drug use: No  . Sexual activity: Yes    Partners: Female  Lifestyle  . Physical activity:    Days per week: Not on file    Minutes per session: Not on file  . Stress: Not on file  Relationships  . Social connections:    Talks on phone: Not on file    Gets together: Not on file    Attends religious service: Not on file    Active member of club or organization: Not on file    Attends meetings of clubs or organizations: Not on file    Relationship status: Not on file  . Intimate partner violence:    Fear of current or ex partner: Not on file    Emotionally abused: Not on file    Physically abused: Not on file    Forced sexual activity: Not on file  Other Topics Concern  . Not on file  Social History Narrative   Married.   1 child.    Works as a Dealer.   Enjoys riding 4-wheelers.  Past Surgical History:  Procedure Laterality Date  . ARTHROSCOPY KNEE W/ DRILLING  1980's   left knee  . HERNIA REPAIR  yrs ago   x 2  . TOTAL HIP ARTHROPLASTY  05/30/2011   Procedure: TOTAL HIP ARTHROPLASTY ANTERIOR APPROACH;  Surgeon: Mcarthur Rossetti;  Location: [facility];  Service: Orthopedics;  Laterality: Right;  Right Total Hip Arthroplasty, Direct Anterior Approach (C-Arm)    Family History  Problem Relation Age of Onset  . Arthritis Father   . Colon cancer Neg Hx   . Esophageal cancer Neg Hx   . Liver cancer Neg Hx   . Pancreatic cancer Neg Hx   . Rectal cancer Neg Hx   . Stomach cancer Neg Hx   . Prostate cancer Neg Hx     No Known Allergies  Current Outpatient Medications on File Prior to Visit  Medication Sig Dispense Refill  . Omega-3 Fatty Acids (FISH OIL) 1000 MG CAPS Take by mouth. Take 2,000 daily    . rosuvastatin (CRESTOR) 5 MG tablet Take 1 tablet (5 mg total) by mouth daily. For cholesterol. (Patient not taking: Reported on 08/04/2018)  90 tablet 3   Current Facility-Administered Medications on File Prior to Visit  Medication Dose Route Frequency Provider Last Rate Last Dose  . 0.9 %  sodium chloride infusion  500 mL Intravenous Once Danis, Estill Cotta III, MD        BP 122/68   Pulse 80   Temp 98 F (36.7 C) (Oral)   Ht 6\' 4"  (1.93 m)   Wt (!) 327 lb 8 oz (148.6 kg)   SpO2 94%   BMI 39.86 kg/m    Objective:   Physical Exam  Constitutional: He appears well-nourished.  Cardiovascular: Normal rate.  Respiratory: Effort normal.  Skin: Skin is warm and dry. No rash noted.  Numerous small patches of hair loss with evidence of follicle growth to sites.  No tinea or other rash noted.           Assessment & Plan:

## 2018-08-13 ENCOUNTER — Other Ambulatory Visit: Payer: Self-pay | Admitting: Primary Care

## 2018-08-13 DIAGNOSIS — L639 Alopecia areata, unspecified: Secondary | ICD-10-CM

## 2018-08-19 ENCOUNTER — Other Ambulatory Visit: Payer: Self-pay | Admitting: Primary Care

## 2018-08-19 DIAGNOSIS — L639 Alopecia areata, unspecified: Secondary | ICD-10-CM

## 2018-08-24 NOTE — Telephone Encounter (Signed)
Last prescribed on 08/04/2018. Last office visit on 08/04/2018. No future appointment

## 2018-08-24 NOTE — Telephone Encounter (Signed)
How is he doing on the betamethasone cream for hair loss? Any improvement?  It does not appear he uses MyChart.

## 2018-08-25 NOTE — Telephone Encounter (Signed)
Spoken to patient. He stated that no more hair loss but no new growth either. He does see improvement and would like a refill. He stated that 30 mg, only lasted about 2 weeks.

## 2018-08-25 NOTE — Telephone Encounter (Signed)
Noted, refills sent to pharmacy. 

## 2018-12-01 ENCOUNTER — Other Ambulatory Visit: Payer: Self-pay

## 2018-12-01 ENCOUNTER — Encounter: Payer: Self-pay | Admitting: Orthopedic Surgery

## 2018-12-01 ENCOUNTER — Ambulatory Visit (INDEPENDENT_AMBULATORY_CARE_PROVIDER_SITE_OTHER): Payer: 59 | Admitting: Orthopedic Surgery

## 2018-12-01 VITALS — Ht 76.0 in | Wt 327.5 lb

## 2018-12-01 DIAGNOSIS — Z6841 Body Mass Index (BMI) 40.0 and over, adult: Secondary | ICD-10-CM | POA: Diagnosis not present

## 2018-12-01 DIAGNOSIS — M545 Low back pain, unspecified: Secondary | ICD-10-CM

## 2018-12-01 DIAGNOSIS — G8929 Other chronic pain: Secondary | ICD-10-CM | POA: Diagnosis not present

## 2018-12-01 MED ORDER — PREDNISONE 10 MG PO TABS: 20.0000 mg | ORAL_TABLET | Freq: Every day | ORAL | 0 refills | Status: DC

## 2018-12-01 MED ORDER — METHOCARBAMOL 500 MG PO TABS: 500.0000 mg | ORAL_TABLET | Freq: Three times a day (TID) | ORAL | 0 refills | Status: DC

## 2018-12-02 ENCOUNTER — Encounter: Payer: Self-pay | Admitting: Orthopedic Surgery

## 2018-12-02 NOTE — Progress Notes (Signed)
Office Visit Note   Patient: Jerome Thomas           Date of Birth: 1966/06/16           MRN: [id number] Visit Date: 12/01/2018              Requested by: Pleas Koch, NP [place],  [place] [id number] PCP: Pleas Koch, NP  Chief Complaint  Patient presents with  . Lower Back - Pain      HPI: Patient is a 53 year old gentleman who presents for initial evaluation for midline lower back pain.  Patient states the onset was on Saturday he denies any injuries he states that it may be due to the bed that he slept on was too hard.  Patient states he is taken Aleve for pain without relief.  He states he is not seen anybody else for this problem.  Assessment & Plan: Visit Diagnoses:  1. Chronic midline low back pain without sciatica   2. Body mass index 40.0-44.9, adult ([place])   3. Morbid obesity ([place])     Plan: Recommended heat to help with the muscle spasm a prescription is also called in for Robaxin and a prescription for low-dose prednisone.  Will obtain radiographs at follow-up if he is still significantly symptomatic.  Follow-Up Instructions: Return in about 4 weeks (around 12/29/2018).   Ortho Exam  Patient is alert, oriented, no adenopathy, well-dressed, normal affect, normal respiratory effort. Examination patient has an antalgic gait he has a negative straight leg raise bilaterally there is no focal motor weakness in either lower extremity.  His paraspinous muscles are tender to palpation.  Radiographs were not obtained today.  Imaging: No results found. No images are attached to the encounter.  Labs: Lab Results  Component Value Date   HGBA1C 5.9 05/28/2018   HGBA1C 6.0 04/08/2017     Lab Results  Component Value Date   ALBUMIN 4.1 07/14/2018   ALBUMIN 4.1 05/28/2018   ALBUMIN 3.9 04/08/2017    Body mass index is 39.86 kg/m.  Orders:  No orders of the defined types were placed in this encounter.  Meds ordered this encounter   Medications  . methocarbamol (ROBAXIN) 500 MG tablet    Sig: Take 1 tablet (500 mg total) by mouth 3 (three) times daily.    Dispense:  30 tablet    Refill:  0  . predniSONE (DELTASONE) 10 MG tablet    Sig: Take 2 tablets (20 mg total) by mouth daily with breakfast.    Dispense:  60 tablet    Refill:  0     Procedures: No procedures performed  Clinical Data: No additional findings.  ROS:  All other systems negative, except as noted in the HPI. Review of Systems  Objective: Vital Signs: Ht 6\' 4"  (1.93 m)   Wt (!) 327 lb 8 oz (148.6 kg)   BMI 39.86 kg/m   Specialty Comments:  No specialty comments available.  PMFS History: Patient Active Problem List   Diagnosis Date Noted  . Alopecia areata 08/04/2018  . Hyperlipidemia 06/02/2018  . Preventative health care 04/08/2017  . COPD (chronic obstructive pulmonary disease) ([place]) 10/05/2012  . Snoring 10/05/2012  . Tobacco use disorder 10/05/2012  . Avascular necrosis of right femoral head ([place]) 05/30/2011   Past Medical History:  Diagnosis Date  . Allergy   . Avascular necrosis ([facility])    right hip  . COPD (chronic obstructive pulmonary disease) ([place])   .  Genital warts     Family History  Problem Relation Age of Onset  . Arthritis Father   . Colon cancer Neg Hx   . Esophageal cancer Neg Hx   . Liver cancer Neg Hx   . Pancreatic cancer Neg Hx   . Rectal cancer Neg Hx   . Stomach cancer Neg Hx   . Prostate cancer Neg Hx     Past Surgical History:  Procedure Laterality Date  . ARTHROSCOPY KNEE W/ DRILLING  1980's   left knee  . HERNIA REPAIR  yrs ago   x 2  . TOTAL HIP ARTHROPLASTY  05/30/2011   Procedure: TOTAL HIP ARTHROPLASTY ANTERIOR APPROACH;  Surgeon: Mcarthur Rossetti;  Location: [facility];  Service: Orthopedics;  Laterality: Right;  Right Total Hip Arthroplasty, Direct Anterior Approach (C-Arm)   Social History   Occupational History  . Occupation: Psychiatrist: [employer]  Tobacco Use  . Smoking status: Current Every Day Smoker    Packs/day: 1.00    Years: 30.00    Pack years: 30.00    Types: Cigarettes  . Smokeless tobacco: Never Used  Substance and Sexual Activity  . Alcohol use: Yes    Comment: occasional  . Drug use: No  . Sexual activity: Yes    Partners: Female

## 2018-12-23 ENCOUNTER — Other Ambulatory Visit: Payer: Self-pay | Admitting: Orthopedic Surgery

## 2019-12-07 ENCOUNTER — Other Ambulatory Visit: Payer: Self-pay

## 2019-12-07 ENCOUNTER — Encounter: Payer: Self-pay | Admitting: Family Medicine

## 2019-12-07 ENCOUNTER — Ambulatory Visit: Payer: 59 | Admitting: Family Medicine

## 2019-12-07 VITALS — BP 110/72 | HR 71 | Temp 98.2°F | Ht 76.0 in | Wt 307.5 lb

## 2019-12-07 DIAGNOSIS — M549 Dorsalgia, unspecified: Secondary | ICD-10-CM

## 2019-12-07 MED ORDER — CYCLOBENZAPRINE HCL 10 MG PO TABS: 5.0000 mg | ORAL_TABLET | Freq: Every evening | ORAL | 1 refills | Status: AC | PRN

## 2019-12-07 MED ORDER — PREDNISONE 20 MG PO TABS: 40.0000 mg | ORAL_TABLET | Freq: Every day | ORAL | 0 refills | Status: AC

## 2019-12-07 NOTE — Progress Notes (Signed)
Erienne Spelman T. Coreen Shippee, MD, [place]  Primary Care and Sports Medicine [facility] at [facility] [place], [address]  Phone: [phone]  FAX: [place] - 54 y.o. male  MRN [id number]  Date of Birth: [birth year]  Date: 12/07/2019  PCP: Pleas Koch, NP  Referral: Pleas Koch, NP  Chief Complaint  Patient presents with  . Back Pain    ?Pulled Muscle-Thinks he hurt it moving the lawnmower on Monday    This visit occurred during the SARS-CoV-2 public health emergency.  Safety protocols were in place, including screening questions prior to the visit, additional usage of staff PPE, and extensive cleaning of exam room while observing appropriate contact time as indicated for disinfecting solutions.   Subjective:   Jerome Thomas is a 54 y.o. very pleasant male patient who presents with the following: Back Pain  ongoing for approximately: 3 or 4 days The patient has had back pain before. The back pain is localized into the lumbar spine area. They also describe no radiculopathy.  He is really having some significant low back pain and difficulty moving and bending.  No numbness or tingling. No bowel or bladder incontinence. No focal weakness. Prior interventions: None Physical therapy: No Chiropractic manipulations: No Acupuncture: No Osteopathic manipulation: No Heat or cold: Minimal effect  Stiff startig on Monday, and then when he came to work, hooked some straps on it, and then with pushing it.  Hurt the next day. Went to work.   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     Review of Systems is noted in the HPI, as appropriate  Objective:   Blood pressure 110/72, pulse 71, temperature 98.2 F (36.8 C), temperature source Temporal, height 6\' 4"  (1.93 m), weight (!) 307 lb 8 oz (139.5 kg), SpO2 98 %.  GEN: No acute distress; alert,appropriate. PULM: Breathing comfortably in no  respiratory distress PSYCH: Normally interactive.   Range of motion at  the waist: Flexion, rotation and lateral bending: Limited to 50 degrees in forward flexion, but rotational lateral bending is intact  No echymosis or edema Rises to examination table with no difficulty Gait: minimally antalgic  Inspection/Deformity: No abnormality Paraspinus T: L2-S1 bilaterally  B Ankle Dorsiflexion (L5,4): 5/5 B Great Toe Dorsiflexion (L5,4): 5/5 Heel Walk (L5): WNL Toe Walk (S1): WNL Rise/Squat (L4): WNL, mild pain  SENSORY B Medial Foot (L4): WNL B Dorsum (L5): WNL B Lateral (S1): WNL Light Touch: WNL Pinprick: WNL  REFLEXES Knee (L4): 2+ Ankle (S1): 2+  B SLR, seated: neg B SLR, supine: neg B Greater Troch: NT B Log Roll: neg B Stork: NT B Sciatic Notch: NT  Radiology: No results found.  Assessment and Plan:     ICD-10-CM   1. Acute back pain, unspecified back location, unspecified back pain laterality  M54.9     Level of Medical Decision-Making in this case is MODERATE.  Anatomy reviewed. Conservative algorithms for acute back pain generally begin with the following: NSAIDS, Muscle Relaxants, Mild pain medication Start with medications, basic rehab, and progress from there following low back pain algorithm. No red flags are present.  Follow-up: No follow-ups on file.  Meds ordered this encounter  Medications  . cyclobenzaprine (FLEXERIL) 10 MG tablet    Sig: Take 0.5-1 tablets (5-10 mg total) by mouth at bedtime as needed for muscle spasms.    Dispense:  30 tablet    Refill:  1  .  predniSONE (DELTASONE) 20 MG tablet    Sig: Take 2 tablets (40 mg total) by mouth daily for 7 days.    Dispense:  14 tablet    Refill:  0   Medications Discontinued During This Encounter  Medication Reason  . betamethasone dipropionate (DIPROLENE) 0.05 % cream Completed Course  . methocarbamol (ROBAXIN) 500 MG tablet Completed Course  . predniSONE (DELTASONE) 10 MG tablet  Completed Course  . 0.9 %  sodium chloride infusion Error  . rosuvastatin (CRESTOR) 5 MG tablet Patient Preference   No orders of the defined types were placed in this encounter.   Signed,  Maud Deed. Jemario Poitras, MD   Outpatient Encounter Medications as of 12/07/2019  Medication Sig  . Omega-3 Fatty Acids (FISH OIL) 1000 MG CAPS Take by mouth. Take 2,000 daily  . cyclobenzaprine (FLEXERIL) 10 MG tablet Take 0.5-1 tablets (5-10 mg total) by mouth at bedtime as needed for muscle spasms.  . predniSONE (DELTASONE) 20 MG tablet Take 2 tablets (40 mg total) by mouth daily for 7 days.  . [DISCONTINUED] betamethasone dipropionate (DIPROLENE) 0.05 % cream APPLY TO AFFECTED AREA TWICE A DAY  . [DISCONTINUED] methocarbamol (ROBAXIN) 500 MG tablet Take 1 tablet (500 mg total) by mouth 3 (three) times daily. (Patient not taking: Reported on 12/07/2019)  . [DISCONTINUED] predniSONE (DELTASONE) 10 MG tablet Take 2 tablets (20 mg total) by mouth daily with breakfast.  . [DISCONTINUED] rosuvastatin (CRESTOR) 5 MG tablet Take 1 tablet (5 mg total) by mouth daily. For cholesterol. (Patient not taking: Reported on 12/07/2019)  . [DISCONTINUED] 0.9 %  sodium chloride infusion    No facility-administered encounter medications on file as of 12/07/2019.

## 2021-01-07 ENCOUNTER — Encounter: Payer: Self-pay | Admitting: Gastroenterology

## 2021-07-02 ENCOUNTER — Telehealth: Payer: Self-pay

## 2021-07-02 NOTE — Telephone Encounter (Signed)
Recommend he continue Augmentin as prescribed, it can take 3-4 days before symptoms improve. I am happy to see him in the office if no improvement.

## 2021-07-02 NOTE — Telephone Encounter (Signed)
I spoke with pts wife (DPR signed); pt was seen 06/30/21 at [facility] in [place].(Do not see in Care everywhere). Pt having sinus congestion and pressure. Pt has more  productive coughing with clearish phlegm and with slight wheezing on and off. No SOB or CP. Pt does not have fever or body aches. On 06/30/21 pt was tested for covid and flu that were both negative. Pt is not in any distress. Pt was given Augmentin 875-125 mg and pt is supposed to take bid. Pt got one dose in on 06/30/21, 2 doses on 07/01/21 and 1 dose so far today. Pts wife just picked up sudafed and saline spray for pt and wonders with this added to abx if should give another day to see if pt will improve. Mrs Auker request cb after note reviewed by Gentry Fitz NP. [facility] [address]. Sending note to Gentry Fitz NP and [facility] CMA.

## 2021-07-02 NOTE — Telephone Encounter (Signed)
Left message to return call to our office.  

## 2021-07-02 NOTE — Telephone Encounter (Signed)
[facility] - Client TELEPHONE ADVICE RECORD [facility] Patient Name: Jerome Thomas Gender: Male DOB: 06-Jan-1966 Age: 56 Y 1 M 11 D Return Phone Number: [phone] (Primary), [phone] (Secondary) Address: City/ State/ Zip: [place] [address] Client [facility] - Client Client Site [place] - Day Provider Alma Friendly - NP Contact Type Call Who Is Calling Patient / Member / Family / Caregiver Call Type Triage / Clinical Caller Name Jerome Thomas Relationship To Patient Spouse Return Phone Number [phone] (Primary) Chief Complaint WHEEZING Reason for Call Symptomatic / Request for [name] has an upper respiratory infection and the medication he was prescribed is not working. He still has a cough and headache with a wheeze. She is seeking medication recommendations. Translation No Nurse Assessment Nurse: Humfleet, RN, Estill Bamberg Date/Time (Eastern Time): 07/02/2021 11:58:04 AM Confirm and document reason for call. If symptomatic, describe symptoms. ---caller states he was seen at [place] and was prescribed augmentin for URI. still having symptoms. no fever, no chest pain, no shortness of breath. says nothing worsening. has been taking tylenol and mucinex. coughing, congestion Does the patient have any new or worsening symptoms? ---Yes Will a triage be completed? ---Yes Related visit to physician within the last 2 weeks? ---Yes Does the PT have any chronic conditions? (i.e. diabetes, asthma, this includes High risk factors for pregnancy, etc.) ---No Is this a behavioral health or substance abuse call? ---No Guidelines Guideline Title Affirmed Question Affirmed Notes Nurse Date/Time (Eastern Time) Infection on Antibiotic Follow-up Call [1] Taking antibiotic < 72 hours (3 days) AND [2] symptoms are SAME  (not improved) Humfleet, RN, Estill Bamberg 07/02/2021 11:59:48 AM PLEASE NOTE: All timestamps contained within this report are represented as [place] Standard Time. CONFIDENTIALTY NOTICE: This fax transmission is intended only for the addressee. It contains information that is legally privileged, confidential or otherwise protected from use or disclosure. If you are not the intended recipient, you are strictly prohibited from reviewing, disclosing, copying using or disseminating any of this information or taking any action in reliance on or regarding this information. If you have received this fax in error, please notify us immediately by telephone so that we can arrange for its return to [place]. Phone: [phone], Toll-Free: [phone], Fax: [phone] Page: 2 of 2 Call Id: [id number] [place]. Time Eilene Ghazi Time) Disposition Final User 07/02/2021 10:06:28 AM Send to Urgent Laural Golden 07/02/2021 11:55:19 AM Send To RN Personal Standifer, RN, Nira Conn 07/02/2021 12:02:32 PM Home Care Yes Humfleet, RN, Shelly Coss Disagree/Comply Comply Caller Understands Yes PreDisposition Did not know what to do Care Advice Given Per Guideline * You should be able to treat this at home. * Provide targeted care advice for the patient's symptom. * Continue the prescribed antibiotic. CONTINUE ANTIBIOTIC: * Also, continue any other treatment instructions from your doctor (or NP/PA). * ACETAMINOPHEN - REGULAR STRENGTH TYLENOL: Take 650 mg (two 325 mg pills) by mouth every 4 to 6 hours as needed. Each Regular Strength Tylenol pill has 325 mg of acetaminophen. The most you should take is 10 pills a day (3,250 mg total). Note: In [place], the maximum is 12 pills a day (3,900 mg total). * After 48 Hours (2 Days): Any fever should be gone. Other symptoms may be GETTING BETTER (improved) or be the SAME. CARE ADVICE given per Infection on Antibiotics Follow-Up Call (Adult) guideline. * After 72 hours (3 Days): You should  be  improving. You should not have any fever. All symptoms should be GETTING BETTER (improved). CALL BACK IF: * You become worse * Symptoms not improving over 72 hours (3 days) on antibiotics * Fever lasts over 48 hours (2 days) on antibiotics Comments User: Rozelle Logan, RN Date/Time Eilene Ghazi Time): 07/02/2021 12:02:31 PM patient denies history of heart/bp problems/meds. advised to take sudafed and use nasal saline spra

## 2021-07-02 NOTE — Telephone Encounter (Signed)
Mrs Cutbirth pts wife (DPR signed) notified as instructed and pts wife voiced understanding and was comfortable with recommendation. UC & ED precautions reviewed again and Mrs Skibicki voiced understanding.

## 2021-07-03 ENCOUNTER — Ambulatory Visit: Payer: 59 | Admitting: Family Medicine

## 2021-07-03 ENCOUNTER — Encounter: Payer: Self-pay | Admitting: Family Medicine

## 2021-07-03 ENCOUNTER — Other Ambulatory Visit: Payer: Self-pay

## 2021-07-03 ENCOUNTER — Ambulatory Visit (INDEPENDENT_AMBULATORY_CARE_PROVIDER_SITE_OTHER)
Admission: RE | Admit: 2021-07-03 | Discharge: 2021-07-03 | Disposition: A | Discharge status: Home / Self Care | Payer: 59 | Source: Ambulatory Visit | Attending: Family Medicine | Admitting: Family Medicine

## 2021-07-03 VITALS — BP 118/76 | HR 104 | Temp 98.1°F | Ht 76.0 in | Wt 317.4 lb

## 2021-07-03 DIAGNOSIS — J441 Chronic obstructive pulmonary disease with (acute) exacerbation: Secondary | ICD-10-CM

## 2021-07-03 DIAGNOSIS — J168 Pneumonia due to other specified infectious organisms: Secondary | ICD-10-CM | POA: Diagnosis not present

## 2021-07-03 MED ORDER — LEVOFLOXACIN 500 MG PO TABS: 500.0000 mg | ORAL_TABLET | Freq: Every day | ORAL | 0 refills | Status: DC

## 2021-07-03 MED ORDER — PREDNISONE 20 MG PO TABS: ORAL_TABLET | ORAL | 0 refills | Status: DC

## 2021-07-03 MED ORDER — ALBUTEROL SULFATE HFA 108 (90 BASE) MCG/ACT IN AERS: 2.0000 | INHALATION_SPRAY | Freq: Four times a day (QID) | RESPIRATORY_TRACT | 0 refills | Status: DC | PRN

## 2021-07-03 NOTE — Progress Notes (Signed)
Jerome Gathright T. Jerome Killilea, MD, [place] at [facility] [place], [address]  Phone: [phone]   FAX: [name]   MRN [id number]   Date of Birth: 20-Mar-1966  Date: 07/03/2021   PCP: Pleas Koch, NP   Referral: Pleas Koch, NP  Chief Complaint  Patient presents with   Cough    Symptoms started last Wednesday   Shortness of Breath   Nasal Congestion   Sinusitis    Seen at Urgent Care in [place] on Sunday.  They tested him for flu and covid which was negative-Rx: Augmentin    This visit occurred during the SARS-CoV-2 public health emergency.  Safety protocols were in place, including screening questions prior to the visit, additional usage of staff PPE, and extensive cleaning of exam room while observing appropriate contact time as indicated for disinfecting solutions.   Subjective:   Jerome Thomas is a 56 y.o. very pleasant male patient with Body mass index is 38.63 kg/m. who presents with the following:  Cough and sinus congestion: He has been feeling short of breath and coughing all of the time, he initially does say 14 days ago.  He is currently taking some Augmentin.  He does not think that he has been having a fever.  When he was at urgent care, they diagnosed him with sinusitis.  Right now, his predominant issues are coughing and breathing.  He has a 2 pack/day smoker, but he is only smoked a couple cigarettes since he has been sick.  Feeling bad for 14 days ago.  Went to UC about 10 days ago.  Cough is still pretty bad Having trouble with coughing at night.    Wife 3 weeks ago sick. Went to [employer].  [employer] - did not go to [facility]  2 PPD.   Review of Systems is noted in the HPI, as appropriate  Objective:   BP 118/76    Pulse (!) 104    Temp 98.1 F (36.7 C) (Temporal)    Ht 6\' 4"  (1.93 m)    Wt (!) 317 lb 6 oz (144 kg)    SpO2 92%    BMI  38.63 kg/m   GEN: No acute distress; alert,appropriate. PULM: Breathing comfortably in no respiratory distress, he has diffuse wheezing throughout all lung fields.  Rhonchorous sounds with some subtle crackles also in bilateral lung fields. PSYCH: Normally interactive.  CV: RRR, no m/g/r   Laboratory and Imaging Data: DG Chest 2 View  Result Date: 07/04/2021 CLINICAL DATA:  Diffuse rhonchi and wheezing smoking history. EXAM: CHEST - 2 VIEW COMPARISON:  12/05/2016 FINDINGS: Heart size upper limits of normal. Mediastinal shadows are normal. Widespread bronchial thickening without evidence of infiltrate, collapse or effusion. No acute bone finding. IMPRESSION: Widespread bronchitis pattern.  No consolidation or collapse. Electronically Signed   By: Nelson Chimes M.D.   On: 07/04/2021 10:40     Assessment and Plan:     ICD-10-CM   1. Pneumonia due to other specified infectious organisms  J16.8 DG Chest 2 View    2. COPD exacerbation ([place])  J44.1 DG Chest 2 View     Examination is worrisome, and I think additionally his x-ray is consistent with community-acquired pneumonia.  I am going to place the patient on Levaquin for 7 days, and given his smoking history and history of COPD, place him on some steroids and give him  a Ventolin inhaler.  He does know to follow-up or seek additional care if he worsens.  Meds ordered this encounter  Medications   levofloxacin (LEVAQUIN) 500 MG tablet    Sig: Take 1 tablet (500 mg total) by mouth daily.    Dispense:  7 tablet    Refill:  0   predniSONE (DELTASONE) 20 MG tablet    Sig: 2 tabs po daily for 5 days, then 1 tab po daily for 5 days    Dispense:  15 tablet    Refill:  0   albuterol (VENTOLIN HFA) 108 (90 Base) MCG/ACT inhaler    Sig: Inhale 2 puffs into the lungs every 6 (six) hours as needed for wheezing or shortness of breath.    Dispense:  1 each    Refill:  0    Dispense a spacer   Medications Discontinued During This Encounter   Medication Reason   Omega-3 Fatty Acids (FISH OIL) 1000 MG CAPS Completed Course   Orders Placed This Encounter  Procedures   DG Chest 2 View    Follow-up: No follow-ups on file.  Dragon [employer] One speech-to-text software was used for transcription in this dictation.  Possible transcriptional errors can occur using Editor, commissioning.   Signed,  Maud Deed. Jauan Wohl, MD   Outpatient Encounter Medications as of 07/03/2021  Medication Sig   albuterol (VENTOLIN HFA) 108 (90 Base) MCG/ACT inhaler Inhale 2 puffs into the lungs every 6 (six) hours as needed for wheezing or shortness of breath.   amoxicillin-clavulanate (AUGMENTIN) 875-125 MG tablet Take 1 tablet by mouth 2 (two) times daily.   levofloxacin (LEVAQUIN) 500 MG tablet Take 1 tablet (500 mg total) by mouth daily.   predniSONE (DELTASONE) 20 MG tablet 2 tabs po daily for 5 days, then 1 tab po daily for 5 days   [DISCONTINUED] Omega-3 Fatty Acids (FISH OIL) 1000 MG CAPS Take by mouth. Take 2,000 daily   No facility-administered encounter medications on file as of 07/03/2021.

## 2021-07-04 NOTE — Telephone Encounter (Signed)
Per message with Mearl Latin has been addressed no further action needed at this time

## 2021-07-05 ENCOUNTER — Encounter: Payer: Self-pay | Admitting: Family Medicine

## 2022-01-01 ENCOUNTER — Ambulatory Visit: Payer: 59 | Admitting: Primary Care

## 2022-01-01 ENCOUNTER — Encounter: Payer: Self-pay | Admitting: Primary Care

## 2022-01-01 VITALS — BP 120/74 | HR 100 | Temp 97.6°F | Ht 76.0 in | Wt 329.0 lb

## 2022-01-01 DIAGNOSIS — R600 Localized edema: Secondary | ICD-10-CM

## 2022-01-01 DIAGNOSIS — Z23 Encounter for immunization: Secondary | ICD-10-CM

## 2022-01-01 DIAGNOSIS — R0989 Other specified symptoms and signs involving the circulatory and respiratory systems: Secondary | ICD-10-CM

## 2022-01-01 DIAGNOSIS — Z1211 Encounter for screening for malignant neoplasm of colon: Secondary | ICD-10-CM | POA: Diagnosis not present

## 2022-01-01 LAB — CBC
HCT: 53.2 % — ABNORMAL HIGH (ref 39.0–52.0)
Hemoglobin: 17.7 g/dL — ABNORMAL HIGH (ref 13.0–17.0)
MCHC: 33.3 g/dL (ref 30.0–36.0)
MCV: 89.6 fl (ref 78.0–100.0)
Platelets: 197 10*3/uL (ref 150.0–400.0)
RBC: 5.93 Mil/uL — ABNORMAL HIGH (ref 4.22–5.81)
RDW: 14 % (ref 11.5–15.5)
WBC: 7.3 10*3/uL (ref 4.0–10.5)

## 2022-01-01 LAB — BRAIN NATRIURETIC PEPTIDE: Pro B Natriuretic peptide (BNP): 34 pg/mL (ref 0.0–100.0)

## 2022-01-01 LAB — COMPREHENSIVE METABOLIC PANEL
ALT: 28 U/L (ref 0–53)
AST: 17 U/L (ref 0–37)
Albumin: 4 g/dL (ref 3.5–5.2)
Alkaline Phosphatase: 100 U/L (ref 39–117)
BUN: 14 mg/dL (ref 6–23)
CO2: 28 mEq/L (ref 19–32)
Calcium: 9.2 mg/dL (ref 8.4–10.5)
Chloride: 103 mEq/L (ref 96–112)
Creatinine, Ser: 0.89 mg/dL (ref 0.40–1.50)
GFR: 96.4 mL/min (ref >60.00)
Glucose, Bld: 98 mg/dL (ref 70–99)
Potassium: 4.2 mEq/L (ref 3.5–5.1)
Sodium: 139 mEq/L (ref 135–145)
Total Bilirubin: 0.6 mg/dL (ref 0.2–1.2)
Total Protein: 6.6 g/dL (ref 6.0–8.3)

## 2022-01-01 LAB — HEMOGLOBIN A1C: Hgb A1c MFr Bld: 6.2 % (ref 4.6–6.5)

## 2022-01-01 LAB — URIC ACID: Uric Acid, Serum: 7.8 mg/dL (ref 4.0–7.8)

## 2022-01-01 NOTE — Patient Instructions (Addendum)
Stop by the lab prior to leaving today. I will notify you of your results once received.   You will be contacted regarding your blood flow studies.  Please let us know if you have not been contacted within two weeks.   You will be contacted regarding your referral to GI for the colonoscopy.  Please let us know if you have not been contacted within two weeks.   Elevate your legs whenever you are sitting.  Start wearing compression socks daily.   Reduce intake of salty food, take out food, junk food.  It was a pleasure to see you today!

## 2022-01-01 NOTE — Progress Notes (Signed)
Subjective:    Patient ID: Jerome Thomas, male    DOB: May 15, 1966, 56 y.o.   MRN: [id number]  HPI  Jerome Thomas is a very pleasant 56 y.o. male with a history of COPD, avascular necrosis of right femoral head, tobacco abuse, hyperlipidemia, prediabetes who presents today to discuss lower extremity swelling.  He has not been seen by me since February 2020. He is also due for colonoscopy and tetanus vaccine.  His swelling is located to the bilateral lower extremities from the mid calf down to the toes which began a few years ago. Over the last year his swelling has increased. His swelling is is improved in the morning when waking, worse by the evening. He does not elevate his feet in the evening when resting at home.   He does notice numbness to his greater toes. Also will notice a bluish discoloration to the medial ankles. 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 He has not tried compression socks.  His wife endorses that "he eats a lot of salt". He endorses frequent consumption of take out food. He does have chronic back and lower extremity pain with walking, improved with rest.   He denies long travel, cough, exertional dyspnea, chest pain, personal history of DVT.  Current smoker of cigarettes since his teenage years. He is currently smoking 1 to 1.5 PPD.   BP Readings from Last 3 Encounters:  01/01/22 120/74  07/03/21 118/76  12/07/19 110/72      Review of Systems  Respiratory:  Negative for cough and shortness of breath.   Cardiovascular:  Positive for leg swelling. Negative for chest pain.  Skin:  Positive for color change. Negative for wound.  Neurological:  Positive for numbness.         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   1 child.    Works as a Dealer.   Enjoys riding 4-wheelers.    Social Determinants of Health   Financial Resource Strain: Not on file  Food Insecurity: Not on file  Transportation Needs: Not on file  Physical Activity: Not on file  Stress: Not on file  Social Connections: Not on file  Intimate Partner Violence: Not on file    Past Surgical History:  Procedure Laterality Date   ARTHROSCOPY KNEE W/ DRILLING  1980's   left knee   HERNIA REPAIR  yrs ago   x 2  TOTAL HIP ARTHROPLASTY  05/30/2011   Procedure: TOTAL HIP ARTHROPLASTY ANTERIOR APPROACH;  Surgeon: Mcarthur Rossetti;  Location: [facility];  Service: Orthopedics;  Laterality: Right;  Right Total Hip Arthroplasty, Direct Anterior Approach (C-Arm)    Family History  Problem Relation Age of Onset   Arthritis Father    Colon cancer Neg Hx    Esophageal cancer Neg Hx    Liver cancer Neg Hx    Pancreatic cancer Neg Hx    Rectal cancer Neg Hx    Stomach cancer Neg Hx    Prostate cancer Neg Hx     No Known Allergies  No current outpatient medications on file prior to visit.   No current facility-administered medications on file prior to visit.    BP 120/74   Pulse 100   Temp 97.6 F (36.4 C) (Oral)   Ht '6\' 4"'$  (1.93 m)   Wt (!) 329 lb (149.2 kg)   SpO2 98%   BMI 40.05 kg/m  Objective:   Physical  Exam Cardiovascular:     Rate and Rhythm: Normal rate and regular rhythm.     Pulses:          Dorsalis pedis pulses are 1+ on the right side and 1+ on the left side.       Posterior tibial pulses are 1+ on the right side and 1+ on the left side.     Comments: Mild bilateral lower extremity swelling from base of the calf through toes. Left worse than right. Trace pitting to left.  Pulmonary:     Effort: Pulmonary effort is normal.     Breath sounds: Normal breath sounds. No wheezing or rales.  Musculoskeletal:     Cervical back: Neck supple.     Right lower leg: Edema present.     Left lower leg: Edema present.  Skin:    General: Skin is warm and dry.  Neurological:     Mental Status: He is alert and oriented to person, place, and time.           Assessment & Plan:   Problem List Items Addressed This Visit       Other   Bilateral lower extremity edema - Primary    Differentials include PAD, venous insufficiency, CHF, obesity. Lower suspicion for DVT.   Pedal pulses were difficult to detect. Given this finding and his history of tobacco abuse we will obtain ABI's.  Checking labs today including BNP, CMP, Uric acid, A1C, CBC.   Discussed use of compression socks, elevating legs when resting, working on weight loss, reducing salt intake.         Relevant Orders   VAS [place] LOWER EXT ART SEG MULTI (SEGMENTALS & LE RAY[place]UDS)   Brain natriuretic peptide   CBC   Comprehensive metabolic panel   Hemoglobin A1c   Uric acid   Other Visit Diagnoses     Screening for colon cancer       Relevant Orders   Ambulatory referral to Gastroenterology   Decreased pedal pulses       Relevant Orders   VAS [place] LOWER EXT ART SEG MULTI (SEGMENTALS & LE RAYNAUDS)          Pleas Koch, NP

## 2022-01-01 NOTE — Assessment & Plan Note (Signed)
Differentials include PAD, venous insufficiency, CHF, obesity. Lower suspicion for DVT.   Pedal pulses were difficult to detect. Given this finding and his history of tobacco abuse we will obtain ABI's.  Checking labs today including BNP, CMP, Uric acid, A1C, CBC.   Discussed use of compression socks, elevating legs when resting, working on weight loss, reducing salt intake.

## 2022-01-02 ENCOUNTER — Other Ambulatory Visit (INDEPENDENT_AMBULATORY_CARE_PROVIDER_SITE_OTHER): Payer: 59

## 2022-01-02 DIAGNOSIS — D582 Other hemoglobinopathies: Secondary | ICD-10-CM | POA: Diagnosis not present

## 2022-01-02 LAB — IBC + FERRITIN
Ferritin: 142.6 ng/mL (ref 22.0–322.0)
Iron: 101 ug/dL (ref 42–165)
Saturation Ratios: 24.3 % (ref 20.0–50.0)
TIBC: 415.8 ug/dL (ref 250.0–450.0)
Transferrin: 297 mg/dL (ref 212.0–360.0)

## 2022-01-02 NOTE — Addendum Note (Signed)
Addended by: Francella Solian on: 01/02/2022 11:39 AM   Modules accepted: Orders

## 2022-01-14 ENCOUNTER — Other Ambulatory Visit: Payer: Self-pay | Admitting: Primary Care

## 2022-01-14 DIAGNOSIS — R0989 Other specified symptoms and signs involving the circulatory and respiratory systems: Secondary | ICD-10-CM

## 2022-01-17 ENCOUNTER — Ambulatory Visit (HOSPITAL_COMMUNITY)
Admission: RE | Admit: 2022-01-17 | Discharge: 2022-01-17 | Disposition: A | Discharge status: Home / Self Care | Payer: 59 | Source: Ambulatory Visit | Attending: Cardiology | Admitting: Cardiology

## 2022-01-17 DIAGNOSIS — R0989 Other specified symptoms and signs involving the circulatory and respiratory systems: Secondary | ICD-10-CM

## 2022-01-21 ENCOUNTER — Other Ambulatory Visit (HOSPITAL_COMMUNITY): Payer: Self-pay | Admitting: Primary Care

## 2022-01-21 DIAGNOSIS — I739 Peripheral vascular disease, unspecified: Secondary | ICD-10-CM

## 2022-01-23 ENCOUNTER — Ambulatory Visit (HOSPITAL_COMMUNITY): Payer: 59

## 2022-01-31 ENCOUNTER — Ambulatory Visit (HOSPITAL_COMMUNITY)
Admission: RE | Admit: 2022-01-31 | Discharge: 2022-01-31 | Disposition: A | Discharge status: Home / Self Care | Payer: 59 | Source: Ambulatory Visit | Attending: Cardiovascular Disease | Admitting: Cardiovascular Disease

## 2022-01-31 ENCOUNTER — Ambulatory Visit (HOSPITAL_BASED_OUTPATIENT_CLINIC_OR_DEPARTMENT_OTHER)
Admission: RE | Admit: 2022-01-31 | Discharge: 2022-01-31 | Disposition: A | Discharge status: Home / Self Care | Payer: 59 | Source: Ambulatory Visit | Attending: Primary Care | Admitting: Primary Care

## 2022-01-31 DIAGNOSIS — I739 Peripheral vascular disease, unspecified: Secondary | ICD-10-CM | POA: Diagnosis not present

## 2022-01-31 DIAGNOSIS — M79605 Pain in left leg: Secondary | ICD-10-CM | POA: Diagnosis not present

## 2022-02-03 ENCOUNTER — Other Ambulatory Visit: Payer: Self-pay | Admitting: Primary Care

## 2022-02-03 DIAGNOSIS — I771 Stricture of artery: Secondary | ICD-10-CM

## 2022-02-11 ENCOUNTER — Telehealth: Payer: Self-pay | Admitting: Primary Care

## 2022-02-11 NOTE — Telephone Encounter (Signed)
Patient notified as instructed by telephone and verbalized understanding. Patient stated that he has tried Ibuprofen and Tylenol which has not helped at all. Patient stated that his wife had some Tramadol and he has taken two of those which has not helped the pain either. Patient stated that he has been taking some shots of Shearon Stalls at night which helps some and will continue to do that until his appointment with vascular.

## 2022-02-11 NOTE — Telephone Encounter (Signed)
I continue to recommend Ibuprofen and/or Tylenol.

## 2022-02-11 NOTE — Telephone Encounter (Signed)
I am not sure what he has tried, but he can try ibuprofen 600 mg every 8 hours or Tylenol at 1000 mg every 8 hours for pain.  Ultimately, he needs to see vascular services for treatment.

## 2022-02-11 NOTE — Telephone Encounter (Signed)
Pt requested medication for blockage in abdomin going down towards leg, foot and heel of foot tends to turn purple at times after walking. Pt stated he has an appt scheduled on 02/24/22 to see a vascular specialist. Pt is wondering if he could get some pain meds until his appt. Pt stated he was seen 01/01/22 by Carlis Abbott for this issue.

## 2022-02-24 ENCOUNTER — Ambulatory Visit (INDEPENDENT_AMBULATORY_CARE_PROVIDER_SITE_OTHER): Payer: 59 | Admitting: Surgery

## 2022-02-24 ENCOUNTER — Encounter: Payer: Self-pay | Admitting: Surgery

## 2022-02-24 VITALS — BP 118/76 | HR 68 | Temp 97.7°F | Resp 20 | Ht 76.0 in | Wt 330.9 lb

## 2022-02-24 DIAGNOSIS — I70213 Atherosclerosis of native arteries of extremities with intermittent claudication, bilateral legs: Secondary | ICD-10-CM | POA: Diagnosis not present

## 2022-02-24 MED ORDER — CILOSTAZOL 100 MG PO TABS: 100.0000 mg | ORAL_TABLET | Freq: Two times a day (BID) | ORAL | 11 refills | Status: DC

## 2022-02-24 MED ORDER — GABAPENTIN 300 MG PO CAPS: 300.0000 mg | ORAL_CAPSULE | Freq: Two times a day (BID) | ORAL | 3 refills | Status: DC

## 2022-02-24 NOTE — Progress Notes (Signed)
[facility] [place]  Patient name: Jerome Thomas MRN: [id number] DOB: September 22, 1965 Sex: male   REQUESTING PROVIDER:    Loma Boston   REASON FOR CONSULT:    Left leg pain  HISTORY OF PRESENT ILLNESS:   Jerome Thomas is a 56 y.o. male, who is here today for evaluation of left leg pain.  The patient is here with his wife.  He states that this has been going on for about a year but has gotten significantly worse recently.  He describes left thigh and calf pain, especially with walking.  It does not bother him at rest.  He also has severe pain in his left big toe and left heel.  He complains of a little bit of swelling as well.  Patient smokes approximately 2 packs a day.  He has undergone hip replacement.  He has COPD.  He has tried statins but did not tolerate them.  He says his hair fell out and his hair turned white when it grew back  PAST MEDICAL HISTORY    Past Medical History:  Diagnosis Date   Allergy    Avascular necrosis (HCC)    right hip   COPD (chronic obstructive pulmonary disease) (HCC)    Genital warts      FAMILY HISTORY   Family History  Problem Relation Age of Onset   Arthritis Father    Colon cancer Neg Hx    Esophageal cancer Neg Hx    Liver cancer Neg Hx    Pancreatic cancer Neg Hx    Rectal cancer Neg Hx    Stomach cancer Neg Hx    Prostate cancer Neg Hx     SOCIAL HISTORY:   Social History   Socioeconomic History   Marital status: Married    Spouse name: Not on file   Number of children: Not on file   Years of education: Not on file   Highest education level: Not on file  Occupational History   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   1 child.    Works as a Dealer.   Enjoys riding 4-wheelers.    Social Determinants of Health   Financial Resource Strain: Not on file  Food Insecurity: Not on file  Transportation Needs: Not on file  Physical Activity: Not on file  Stress: Not on file  Social Connections: Not on file  Intimate Partner Violence: Not on file    ALLERGIES:    No Known Allergies  CURRENT MEDICATIONS:    No current outpatient medications on file.   No current facility-administered medications for this visit.    REVIEW OF SYSTEMS:   '[X]'$  denotes positive finding, '[ ]'$  denotes negative finding Cardiac  Comments:  Chest pain or chest pressure:    Shortness of breath upon exertion:    Short of breath when lying flat:    Irregular heart rhythm:  Vascular    Pain in calf, thigh, or hip brought on by ambulation: x   Pain in feet at night that wakes you up from your sleep:     Blood clot in your veins:    Leg swelling:         Pulmonary    Oxygen at home:    Productive cough:     Wheezing:         Neurologic    Sudden weakness in arms or legs:     Sudden numbness in arms or legs:     Sudden onset of difficulty speaking or slurred speech:    Temporary loss of vision in one eye:     Problems with dizziness:         Gastrointestinal    Blood in stool:      Vomited blood:         Genitourinary    Burning when urinating:     Blood in urine:        Psychiatric    Major depression:         Hematologic    Bleeding problems:    Problems with blood clotting too easily:        Skin    Rashes or ulcers:        Constitutional    Fever or chills:     PHYSICAL EXAM:   There were no vitals filed for this visit.  GENERAL: The patient is a well-nourished male, in no acute distress. The  vital signs are documented above. CARDIAC: There is a regular rate and rhythm.  VASCULAR: Nonpalpable pedal pulses PULMONARY: Nonlabored respirations ABDOMEN: Soft and non-tender with normal pitched bowel sounds.  MUSCULOSKELETAL: There are no major deformities or cyanosis. NEUROLOGIC: No focal weakness or paresthesias are detected. SKIN: Slightly mottled appearance of the left great toe  PSYCHIATRIC: The patient has a normal affect.  STUDIES:   I have reviewed the following: Stenosis: +-------------------+-------------+  Location           Stenosis       +-------------------+-------------+  Left Common Iliac  >50% stenosis  +-------------------+-------------+  Left External Iliac>50% stenosis  +-------------------+-------------+   >50% stenosis in the left distal common iliac artery/proximal external  iliac artery.   IVC/Iliac: There is no evidence of thrombus involving the IVC   Right:  No focal stenosis.  Three vessel run off.   Left: No focal stenosis; blunted triphasic flow.  three vessel run off. See AOIL duplex; showes >50% iliac stenosis.   ABI/TBIToday's ABIToday's TBIPrevious ABIPrevious TBI  +-------+-----------+-----------+------------+------------+  Right  1.12       .72                                  +-------+-----------+-----------+------------+------------+  Left   .95        .76                                  +-------+-----------+-----------+------------+------------+  Right toe equals 103 Left toe equals 108 Waveforms are triphasic  ASSESSMENT and PLAN   Possible peripheral vascular disease: The patient was found to have iliac stenosis on ultrasound.  He has triphasic waveforms bilaterally and normal toe pressures.  I discussed with the patient and his wife that based off of his iliac stenosis.  His appearance and discomfort could potentially  be related to arterial insufficiency.  However, before proceeding with  angiography, I would like to give him a chance at improvement with medications.  I Minna start him on gabapentin to help with the pain in his left great toe and heel.  I am also can start him on cilostazol to see if this helps with his walking.  I had a lengthy discussion with him regarding smoking cessation.  Our goal is to cut back to 1 pack a day from 2 before I see him again in 3 weeks.  If he is not any better in 3 weeks, the neck step would be angiography.   Leia Alf, MD, FACS [facility] [phone] Pager [phone]

## 2022-03-06 ENCOUNTER — Encounter: Payer: Self-pay | Admitting: Surgery

## 2022-03-17 ENCOUNTER — Encounter: Payer: Self-pay | Admitting: Surgery

## 2022-03-17 ENCOUNTER — Ambulatory Visit (INDEPENDENT_AMBULATORY_CARE_PROVIDER_SITE_OTHER): Payer: 59 | Admitting: Surgery

## 2022-03-17 VITALS — BP 140/80 | HR 94 | Temp 97.9°F | Resp 20 | Ht 76.0 in | Wt 332.5 lb

## 2022-03-17 DIAGNOSIS — I70213 Atherosclerosis of native arteries of extremities with intermittent claudication, bilateral legs: Secondary | ICD-10-CM | POA: Diagnosis not present

## 2022-03-17 NOTE — H&P (View-Only) (Signed)
[facility] [place]  Patient name: Jerome Thomas MRN: [id number] DOB: [birth year] Sex: male   REASON FOR VISIT:    Follow up  HISOTRY OF PRESENT ILLNESS:    Jerome Thomas is a 56 y.o. male who I met 1 month ago for evaluation of left leg pain.  He stated that the pain has been going on for about a year but have gotten significantly worse recently.  He describes left thigh and calf pain with walking.  It does not bother him at rest.  He also complained of severe pain in his left big toe and left heel as well as some mild swelling.  The patient's ABIs were 1.12 on the right and 0.95 on the left.  He did have greater than 50% common iliac artery stenosis.  I discussed with the patient that because of his studies, arterial insufficiency could potentially be the etiology although there could be other reasons for his leg pain.  I decided to start him on gabapentin to help him with his left great toe and heel pain.  I also started him on cilostazol to see if this improves his claudication like symptoms.  We had a lengthy discussion regarding smoking cessation with a goal of cutting back to 1 pack from 2 before today.  He has started his nicotine patches and has done an excellent job of cutting back to about 10 cigarettes a day.  He still having pain in his left big toe and heel as well as with ambulating.  Neither the cilostazol nor gabapentin helped  Patient smokes approximately 2 packs a day.  He has undergone hip replacement.  He has COPD.  He has tried statins but did not tolerate them.  He says his hair fell out and his hair turned white when it grew back   PAST MEDICAL HISTORY:   Past Medical History:  Diagnosis Date   Allergy    Avascular necrosis ([place])    right hip   COPD (chronic obstructive pulmonary disease) (HCC)    Genital warts      FAMILY HISTORY:   Family History  Problem Relation Age of Onset   Arthritis Father     Colon cancer Neg Hx    Esophageal cancer Neg Hx    Liver cancer Neg Hx    Pancreatic cancer Neg Hx    Rectal cancer Neg Hx    Stomach cancer Neg Hx    Prostate cancer Neg Hx     SOCIAL HISTORY:   Social History   Tobacco Use   Smoking status: Every Day    Packs/day: 0.75    Years: 30.00    Total pack years: 22.50    Types: Cigarettes   Smokeless tobacco: Never   Tobacco comments:    Using Nicotine patches  Substance Use Topics   Alcohol use: Yes    Comment: occasional     ALLERGIES:   No Known Allergies   CURRENT MEDICATIONS:   Current Outpatient Medications  Medication Sig Dispense Refill   cilostazol (PLETAL) 100 MG tablet Take 1 tablet (100 mg total) by mouth 2 (two) times daily before a meal. 60 tablet 11   gabapentin (NEURONTIN) 300 MG capsule Take 1 capsule (300 mg total) by mouth 2 (two) times daily. 60 capsule 3   nicotine (NICODERM CQ - DOSED IN MG/24 HOURS) 21 mg/24hr patch Place 21 mg onto the skin daily.     No current facility-administered medications for this visit.  REVIEW OF SYSTEMS:   _0  denotes positive finding, _1  denotes negative finding Cardiac  Comments:  Chest pain or chest pressure:    Shortness of breath upon exertion:    Short of breath when lying flat:    Irregular heart rhythm:        Vascular    Pain in calf, thigh, or hip brought on by ambulation: x   Pain in feet at night that wakes you up from your sleep:  x   Blood clot in your veins:    Leg swelling:         Pulmonary    Oxygen at home:    Productive cough:     Wheezing:         Neurologic    Sudden weakness in arms or legs:     Sudden numbness in arms or legs:     Sudden onset of difficulty speaking or slurred speech:    Temporary loss of vision in one eye:     Problems with dizziness:         Gastrointestinal    Blood in stool:     Vomited blood:         Genitourinary    Burning when urinating:     Blood in urine:        Psychiatric    Major  depression:         Hematologic    Bleeding problems:    Problems with blood clotting too easily:        Skin    Rashes or ulcers:        Constitutional    Fever or chills:      PHYSICAL EXAM:   Vitals:   03/17/22 1213  BP: (!) 140/80  Pulse: 94  Resp: 20  Temp: 97.9 F (36.6 C)  SpO2: 94%  Weight: (!) 332 lb 8 oz (150.8 kg)  Height: _2  (1.93 m)    GENERAL: The patient is a well-nourished male, in no acute distress. The vital signs are documented above. CARDIAC: There is a regular rate and rhythm.  PULMONARY: Non-labored respirations MUSCULOSKELETAL: There are no major deformities or cyanosis. NEUROLOGIC: No focal weakness or paresthesias are detected. SKIN: There are no ulcers or rashes noted. PSYCHIATRIC: The patient has a normal affect.  STUDIES:   None  MEDICAL ISSUES:   Leg pain, left greater than right: I still think a majority of his symptoms are neuropathic, possibly from his back however he has undergone physical therapy and injections in the past with no relief.  He does however endorse significant claudication symptoms in his calf and thigh with walking short distances.  We do not have exercise ABIs, but the ultrasound does suggest iliac stenosis.  I propose proceeding with angiography via right femoral approach and bilateral runoff to evaluate his blood flow and intervene on the iliac arteries particular on the left if indicated.  I discussed the risk the procedure including the risk of bleeding.  This is been scheduled for next Tuesday.  I also increased his gabapentin dose and told him to stop taking the cilostazol  Dr. Ninfa Linden has performed a right hip replacement.  He would like to go back and see him for further evaluation of his leg troubles.  I will make that referral.    Leia Alf, MD, FACS [facility] [phone] Pager [phone]

## 2022-03-17 NOTE — Progress Notes (Addendum)
 [facility] [place]  Patient name: Ruger G Cosgriff MRN: [id number] DOB: 05/31/1966 Sex: male   REASON FOR VISIT:    Follow up  HISOTRY OF PRESENT ILLNESS:    Endi G Gotts is a 56 y.o. male who I met 1 month ago for evaluation of left leg pain.  He stated that the pain has been going on for about a year but have gotten significantly worse recently.  He describes left thigh and calf pain with walking.  It does not bother him at rest.  He also complained of severe pain in his left big toe and left heel as well as some mild swelling.  The patient's ABIs were 1.12 on the right and 0.95 on the left.  He did have greater than 50% common iliac artery stenosis.  I discussed with the patient that because of his studies, arterial insufficiency could potentially be the etiology although there could be other reasons for his leg pain.  I decided to start him on gabapentin to help him with his left great toe and heel pain.  I also started him on cilostazol to see if this improves his claudication like symptoms.  We had a lengthy discussion regarding smoking cessation with a goal of cutting back to 1 pack from 2 before today.  He has started his nicotine patches and has done an excellent job of cutting back to about 10 cigarettes a day.  He still having pain in his left big toe and heel as well as with ambulating.  Neither the cilostazol nor gabapentin helped  Patient smokes approximately 2 packs a day.  He has undergone hip replacement.  He has COPD.  He has tried statins but did not tolerate them.  He says his hair fell out and his hair turned white when it grew back   PAST MEDICAL HISTORY:   Past Medical History:  Diagnosis Date   Allergy    Avascular necrosis (HCC)    right hip   COPD (chronic obstructive pulmonary disease) (HCC)    Genital warts      FAMILY HISTORY:   Family History  Problem Relation Age of Onset   Arthritis Father     Colon cancer Neg Hx    Esophageal cancer Neg Hx    Liver cancer Neg Hx    Pancreatic cancer Neg Hx    Rectal cancer Neg Hx    Stomach cancer Neg Hx    Prostate cancer Neg Hx     SOCIAL HISTORY:   Social History   Tobacco Use   Smoking status: Every Day    Packs/day: 0.75    Years: 30.00    Total pack years: 22.50    Types: Cigarettes   Smokeless tobacco: Never   Tobacco comments:    Using Nicotine patches  Substance Use Topics   Alcohol use: Yes    Comment: occasional     ALLERGIES:   No Known Allergies   CURRENT MEDICATIONS:   Current Outpatient Medications  Medication Sig Dispense Refill   cilostazol (PLETAL) 100 MG tablet Take 1 tablet (100 mg total) by mouth 2 (two) times daily before a meal. 60 tablet 11   gabapentin (NEURONTIN) 300 MG capsule Take 1 capsule (300 mg total) by mouth 2 (two) times daily. 60 capsule 3   nicotine (NICODERM CQ - DOSED IN MG/24 HOURS) 21 mg/24hr patch Place 21 mg onto the skin daily.     No current facility-administered medications for this visit.      REVIEW OF SYSTEMS:   [X] denotes positive finding, [ ] denotes negative finding Cardiac  Comments:  Chest pain or chest pressure:    Shortness of breath upon exertion:    Short of breath when lying flat:    Irregular heart rhythm:        Vascular    Pain in calf, thigh, or hip brought on by ambulation: x   Pain in feet at night that wakes you up from your sleep:  x   Blood clot in your veins:    Leg swelling:         Pulmonary    Oxygen at home:    Productive cough:     Wheezing:         Neurologic    Sudden weakness in arms or legs:     Sudden numbness in arms or legs:     Sudden onset of difficulty speaking or slurred speech:    Temporary loss of vision in one eye:     Problems with dizziness:         Gastrointestinal    Blood in stool:     Vomited blood:         Genitourinary    Burning when urinating:     Blood in urine:        Psychiatric    Major  depression:         Hematologic    Bleeding problems:    Problems with blood clotting too easily:        Skin    Rashes or ulcers:        Constitutional    Fever or chills:      PHYSICAL EXAM:   Vitals:   03/17/22 1213  BP: (!) 140/80  Pulse: 94  Resp: 20  Temp: 97.9 F (36.6 C)  SpO2: 94%  Weight: (!) 332 lb 8 oz (150.8 kg)  Height: 6' 4" (1.93 m)    GENERAL: The patient is a well-nourished male, in no acute distress. The vital signs are documented above. CARDIAC: There is a regular rate and rhythm.  PULMONARY: Non-labored respirations MUSCULOSKELETAL: There are no major deformities or cyanosis. NEUROLOGIC: No focal weakness or paresthesias are detected. SKIN: There are no ulcers or rashes noted. PSYCHIATRIC: The patient has a normal affect.  STUDIES:   None  MEDICAL ISSUES:   Leg pain, left greater than right: I still think a majority of his symptoms are neuropathic, possibly from his back however he has undergone physical therapy and injections in the past with no relief.  He does however endorse significant claudication symptoms in his calf and thigh with walking short distances.  We do not have exercise ABIs, but the ultrasound does suggest iliac stenosis.  I propose proceeding with angiography via right femoral approach and bilateral runoff to evaluate his blood flow and intervene on the iliac arteries particular on the left if indicated.  I discussed the risk the procedure including the risk of bleeding.  This is been scheduled for next Tuesday.  I also increased his gabapentin dose and told him to stop taking the cilostazol  Dr. Blackman has performed a right hip replacement.  He would like to go back and see him for further evaluation of his leg troubles.  I will make that referral.    Wells Anamae Rochelle, IV, MD, FACS [facility] [place] Tel [phone] Pager [phone]  

## 2022-03-19 ENCOUNTER — Other Ambulatory Visit: Payer: Self-pay

## 2022-03-19 DIAGNOSIS — I70223 Atherosclerosis of native arteries of extremities with rest pain, bilateral legs: Secondary | ICD-10-CM

## 2022-03-25 ENCOUNTER — Encounter (HOSPITAL_COMMUNITY)
Admission: RE | Disposition: A | Discharge status: Home / Self Care | Payer: Self-pay | Source: Home / Self Care | Attending: Surgery

## 2022-03-25 ENCOUNTER — Ambulatory Visit (HOSPITAL_COMMUNITY)
Admission: RE | Admit: 2022-03-25 | Discharge: 2022-03-25 | Disposition: A | Discharge status: Home / Self Care | Payer: 59 | Attending: Surgery | Admitting: Surgery

## 2022-03-25 ENCOUNTER — Other Ambulatory Visit: Payer: Self-pay

## 2022-03-25 DIAGNOSIS — F17210 Nicotine dependence, cigarettes, uncomplicated: Secondary | ICD-10-CM | POA: Diagnosis not present

## 2022-03-25 DIAGNOSIS — I70223 Atherosclerosis of native arteries of extremities with rest pain, bilateral legs: Secondary | ICD-10-CM | POA: Insufficient documentation

## 2022-03-25 DIAGNOSIS — Z96641 Presence of right artificial hip joint: Secondary | ICD-10-CM | POA: Diagnosis not present

## 2022-03-25 DIAGNOSIS — I708 Atherosclerosis of other arteries: Secondary | ICD-10-CM | POA: Diagnosis not present

## 2022-03-25 DIAGNOSIS — J449 Chronic obstructive pulmonary disease, unspecified: Secondary | ICD-10-CM | POA: Diagnosis not present

## 2022-03-25 HISTORY — PX: PERIPHERAL VASCULAR INTERVENTION: CATH118257

## 2022-03-25 HISTORY — PX: ABDOMINAL AORTOGRAM W/LOWER EXTREMITY: CATH118223

## 2022-03-25 LAB — POCT I-STAT, CHEM 8
BUN: 14 mg/dL (ref 6–20)
Calcium, Ion: 1.17 mmol/L (ref 1.15–1.40)
Chloride: 103 mmol/L (ref 98–111)
Creatinine, Ser: 0.8 mg/dL (ref 0.61–1.24)
Glucose, Bld: 99 mg/dL (ref 70–99)
HCT: 50 % (ref 39.0–52.0)
Hemoglobin: 17 g/dL (ref 13.0–17.0)
Potassium: 4.1 mmol/L (ref 3.5–5.1)
Sodium: 140 mmol/L (ref 135–145)
TCO2: 27 mmol/L (ref 22–32)

## 2022-03-25 SURGERY — ABDOMINAL AORTOGRAM W/LOWER EXTREMITY
Anesthesia: LOCAL

## 2022-03-25 MED ORDER — MORPHINE SULFATE (PF) 2 MG/ML IV SOLN
INTRAVENOUS | Status: AC
  Filled 2022-03-25: qty 1

## 2022-03-25 MED ORDER — HEPARIN SODIUM (PORCINE) 1000 UNIT/ML IJ SOLN
INTRAMUSCULAR | Status: DC | PRN
  Administered 2022-03-25: 10000 [IU] via INTRAVENOUS

## 2022-03-25 MED ORDER — LABETALOL HCL 5 MG/ML IV SOLN: 10.0000 mg | INTRAVENOUS | Status: DC | PRN

## 2022-03-25 MED ORDER — LIDOCAINE HCL (PF) 1 % IJ SOLN
INTRAMUSCULAR | Status: AC
  Filled 2022-03-25: qty 30

## 2022-03-25 MED ORDER — SODIUM CHLORIDE 0.9% FLUSH: 3.0000 mL | Freq: Two times a day (BID) | INTRAVENOUS | Status: DC

## 2022-03-25 MED ORDER — SODIUM CHLORIDE 0.9% FLUSH: 3.0000 mL | INTRAVENOUS | Status: DC | PRN

## 2022-03-25 MED ORDER — SODIUM CHLORIDE 0.9 % WEIGHT BASED INFUSION
1.0000 mL/kg/h | INTRAVENOUS | Status: DC
  Administered 2022-03-25: 1 mL/kg/h via INTRAVENOUS

## 2022-03-25 MED ORDER — SODIUM CHLORIDE 0.9 % IV SOLN: 250.0000 mL | INTRAVENOUS | Status: DC | PRN

## 2022-03-25 MED ORDER — PROTAMINE SULFATE 10 MG/ML IV SOLN
INTRAVENOUS | Status: DC | PRN
  Administered 2022-03-25: 45 mg via INTRAVENOUS
  Administered 2022-03-25: 5 mg via INTRAVENOUS

## 2022-03-25 MED ORDER — MIDAZOLAM HCL 2 MG/2ML IJ SOLN
INTRAMUSCULAR | Status: AC
  Filled 2022-03-25: qty 2

## 2022-03-25 MED ORDER — PROTAMINE SULFATE 10 MG/ML IV SOLN
INTRAVENOUS | Status: AC
  Filled 2022-03-25: qty 5

## 2022-03-25 MED ORDER — ASPIRIN 81 MG PO TBEC: 81.0000 mg | DELAYED_RELEASE_TABLET | Freq: Every day | ORAL | 2 refills | Status: DC

## 2022-03-25 MED ORDER — ASPIRIN 81 MG PO TBEC
81.0000 mg | DELAYED_RELEASE_TABLET | Freq: Every day | ORAL | Status: DC
  Administered 2022-03-25: 81 mg via ORAL
  Filled 2022-03-25: qty 1

## 2022-03-25 MED ORDER — LIDOCAINE HCL (PF) 1 % IJ SOLN
INTRAMUSCULAR | Status: DC | PRN
  Administered 2022-03-25: 20 mL via INTRADERMAL

## 2022-03-25 MED ORDER — OXYCODONE HCL 5 MG PO TABS: 5.0000 mg | ORAL_TABLET | ORAL | Status: DC | PRN

## 2022-03-25 MED ORDER — ROSUVASTATIN CALCIUM 10 MG PO TABS: 10.0000 mg | ORAL_TABLET | Freq: Every day | ORAL | 11 refills | Status: DC

## 2022-03-25 MED ORDER — FENTANYL CITRATE (PF) 100 MCG/2ML IJ SOLN
INTRAMUSCULAR | Status: AC
  Filled 2022-03-25: qty 2

## 2022-03-25 MED ORDER — IODIXANOL 320 MG/ML IV SOLN
INTRAVENOUS | Status: DC | PRN
  Administered 2022-03-25: 175 mL via INTRA_ARTERIAL

## 2022-03-25 MED ORDER — CLOPIDOGREL BISULFATE 75 MG PO TABS: 75.0000 mg | ORAL_TABLET | Freq: Every day | ORAL | 11 refills | Status: DC

## 2022-03-25 MED ORDER — HEPARIN SODIUM (PORCINE) 1000 UNIT/ML IJ SOLN
INTRAMUSCULAR | Status: AC
  Filled 2022-03-25: qty 10

## 2022-03-25 MED ORDER — HEPARIN (PORCINE) IN NACL 1000-0.9 UT/500ML-% IV SOLN
INTRAVENOUS | Status: AC
  Filled 2022-03-25: qty 1000

## 2022-03-25 MED ORDER — MIDAZOLAM HCL 2 MG/2ML IJ SOLN
INTRAMUSCULAR | Status: DC | PRN
  Administered 2022-03-25: 2 mg via INTRAVENOUS

## 2022-03-25 MED ORDER — CLOPIDOGREL BISULFATE 300 MG PO TABS
ORAL_TABLET | ORAL | Status: DC | PRN
  Administered 2022-03-25: 300 mg via ORAL

## 2022-03-25 MED ORDER — FENTANYL CITRATE (PF) 100 MCG/2ML IJ SOLN
INTRAMUSCULAR | Status: DC | PRN
  Administered 2022-03-25: 50 ug via INTRAVENOUS

## 2022-03-25 MED ORDER — ACETAMINOPHEN 325 MG PO TABS: 650.0000 mg | ORAL_TABLET | ORAL | Status: DC | PRN

## 2022-03-25 MED ORDER — SODIUM CHLORIDE 0.9 % IV SOLN: INTRAVENOUS | Status: DC

## 2022-03-25 MED ORDER — ONDANSETRON HCL 4 MG/2ML IJ SOLN: 4.0000 mg | Freq: Four times a day (QID) | INTRAMUSCULAR | Status: DC | PRN

## 2022-03-25 MED ORDER — HYDRALAZINE HCL 20 MG/ML IJ SOLN: 5.0000 mg | INTRAMUSCULAR | Status: DC | PRN

## 2022-03-25 MED ORDER — HEPARIN (PORCINE) IN NACL 1000-0.9 UT/500ML-% IV SOLN
INTRAVENOUS | Status: DC | PRN
  Administered 2022-03-25 (×2): 500 mL

## 2022-03-25 MED ORDER — CLOPIDOGREL BISULFATE 75 MG PO TABS: 75.0000 mg | ORAL_TABLET | Freq: Every day | ORAL | Status: DC

## 2022-03-25 MED ORDER — CLOPIDOGREL BISULFATE 300 MG PO TABS
ORAL_TABLET | ORAL | Status: AC
  Filled 2022-03-25: qty 1

## 2022-03-25 MED ORDER — ROSUVASTATIN CALCIUM 5 MG PO TABS
10.0000 mg | ORAL_TABLET | Freq: Every day | ORAL | Status: DC
  Administered 2022-03-25: 10 mg via ORAL
  Filled 2022-03-25: qty 2

## 2022-03-25 MED ORDER — MORPHINE SULFATE (PF) 2 MG/ML IV SOLN
2.0000 mg | INTRAVENOUS | Status: DC | PRN
  Administered 2022-03-25: 2 mg via INTRAVENOUS

## 2022-03-25 MED ORDER — CLOPIDOGREL BISULFATE 75 MG PO TABS: 300.0000 mg | ORAL_TABLET | Freq: Once | ORAL | Status: DC

## 2022-03-25 SURGICAL SUPPLY — 21 items
BALLN MUSTANG 9X40X75 (BALLOONS) ×2
BALLOON MUSTANG 9X40X75 (BALLOONS) IMPLANT
CATH OMNI FLUSH 5F 65CM (CATHETERS) IMPLANT
GLIDEWIRE NITREX 0.018X80X5 (WIRE) ×2
GUIDEWIRE NITREX 0.018X80X5 (WIRE) IMPLANT
KIT ENCORE 26 ADVANTAGE (KITS) IMPLANT
KIT MICROPUNCTURE NIT STIFF (SHEATH) IMPLANT
KIT PV (KITS) ×2 IMPLANT
SHEATH FLEX ANSEL ST 6FR 45CM (SHEATH) IMPLANT
SHEATH PINNACLE 5F 10CM (SHEATH) IMPLANT
SHEATH PINNACLE 6F 10CM (SHEATH) IMPLANT
SHEATH PINNACLE 7F 10CM (SHEATH) IMPLANT
SHEATH PROBE COVER 6X72 (BAG) IMPLANT
STENT EPIC 10X40X75 (Permanent Stent) IMPLANT
STENT EPIC VASCULAR 12X40X75 (Permanent Stent) IMPLANT
SYR MEDRAD MARK 7 150ML (SYRINGE) ×2 IMPLANT
TRANSDUCER W/STOPCOCK (MISCELLANEOUS) ×2 IMPLANT
TRAY PV CATH (CUSTOM PROCEDURE TRAY) ×2 IMPLANT
WIRE BENTSON .035X145CM (WIRE) IMPLANT
WIRE HI TORQ VERSACORE 300 (WIRE) IMPLANT
WIRE TORQFLEX AUST .018X40CM (WIRE) IMPLANT

## 2022-03-25 NOTE — Op Note (Signed)
Patient name: Jerome Thomas MRN: [id number] DOB: 1965/06/18 Sex: male  03/25/2022 Pre-operative Diagnosis: Bilateral leg pain left greater than right Post-operative diagnosis:  Same Surgeon:  [name] Procedure Performed:  1.  Ultrasound-guided access, right femoral artery  2.  Abdominal aortogram  3.  Bilateral lower extremity runoff  4.  Stent, left external iliac artery  5.  Stent, left common iliac artery  6.  Conscious sedation,16mnutes   Indications: This is a 56year old gentleman with severe bilateral leg pain who comes in today for angiographic evaluation.  Procedure:  The patient was identified in the holding area and taken to room 8.  The patient was then placed supine on the table and prepped and draped in the usual sterile fashion.  A time out was called.  Conscious sedation was administered with the use of IV fentanyl and Versed under continuous physician and nurse monitoring.  Heart rate, blood pressure, and oxygen saturation were continuously monitored.  Total sedation time was 631mutes.  Ultrasound was used to evaluate the right common femoral artery.  It was patent .  A digital ultrasound image was acquired.  A micropuncture needle was used to access the right common femoral artery under ultrasound guidance.  An 018 wire was advanced without resistance and a micropuncture sheath was placed.  The 018 wire was removed and a benson wire was placed.  The micropuncture sheath was exchanged for a 5 french sheath.  An omniflush catheter was advanced over the wire to the level of L-1.  An abdominal angiogram was obtained.  Next, using the omniflush catheter and a benson wire, the aortic bifurcation was crossed and the catheter was placed into theleft external iliac artery and left runoff was obtained.  right runoff was performed via retrograde sheath injections.  Findings:   Aortogram: No significant renal artery stenosis was identified.  The infrarenal abdominal aorta is  widely patent.  The right common and external and internal iliac arteries are widely patent.  There is a 70% stenosis within the distal left common iliac artery.  The internal and external iliac arteries are widely patent.  Right Lower Extremity: Right common femoral profundofemoral, and superficial femoral artery are widely patent.  There appears to be three-vessel runoff  Left Lower Extremity: Left common femoral profundofemoral and superficial femoral artery are widely patent.  The popliteal arteries widely patent.  The dominant runoff is the posterior tibial, however the peroneal is patent down to the ankle as is the anterior tibial artery  Intervention: After the above images were acquired the decision was made to proceed with intervention.  A 6 French 45 cm sheath was inserted into the left external iliac artery and a versa core wire was placed.  The patient was fully heparinized.  I elected to primarily stent the lesion.  I initially used a epic 10 x 40 stent and deployed this across the lesion.  Unfortunately the stent migrated forward and as result I was not satisfied with the position of the stent and so I ultimately used a second epic 12 x 40 stent with about 50% overlap.  Both stents were postdilated with a 9 mm balloon.  Actually was happy with this configuration because of the size disparity between the common and external iliac artery.  On completion imaging there was no residual stenosis.  Impression:  #1  Approximate 70% left distal common iliac stenosis treated using a epic 10 x 40 distally, and 12 x 40 proximally  #2  No  significant outflow or runoff disease bilaterally   V. [name], M.D., [facility] [place] Office: [phone] Pager:  [phone]

## 2022-03-25 NOTE — Interval H&P Note (Signed)
History and Physical Interval Note:  03/25/2022 7:25 AM  Jerome Thomas  has presented today for surgery, with the diagnosis of atherosclerosis of native artery of extremities with irest pain - bilateral legs.  The various methods of treatment have been discussed with the patient and family. After consideration of risks, benefits and other options for treatment, the patient has consented to  Procedure(s): ABDOMINAL AORTOGRAM W/LOWER EXTREMITY (N/A) as a surgical intervention.  The patient's history has been reviewed, patient examined, no change in status, stable for surgery.  I have reviewed the patient's chart and labs.  Questions were answered to the patient's satisfaction.     [name]

## 2022-03-25 NOTE — Progress Notes (Addendum)
SITE AREA: right groin/femoral  SITE PRIOR TO REMOVAL:  LEVEL 1  PRESSURE APPLIED FOR: approximately 20 minutes   MANUAL: yes  PATIENT STATUS DURING PULL: stable, pain med given  POST PULL SITE:  LEVEL 1  POST PULL INSTRUCTIONS GIVEN: yes  POST PULL PULSES PRESENT: bilateral pedal pulses +1  DRESSING APPLIED: gauze with tegaderm  BEDREST BEGINS @ 1204  COMMENTS:  Dr Trula Slade gave ok to remove sheath although unable to obtain another ACT, no blood return from sheath noted

## 2022-03-26 ENCOUNTER — Encounter (HOSPITAL_COMMUNITY): Payer: Self-pay | Admitting: Surgery

## 2022-03-26 LAB — POCT ACTIVATED CLOTTING TIME: Activated Clotting Time: 263 seconds

## 2022-03-27 ENCOUNTER — Encounter: Payer: Self-pay | Admitting: Surgery

## 2022-04-03 ENCOUNTER — Other Ambulatory Visit: Payer: Self-pay | Admitting: *Deleted

## 2022-04-03 DIAGNOSIS — I70223 Atherosclerosis of native arteries of extremities with rest pain, bilateral legs: Secondary | ICD-10-CM

## 2022-04-23 NOTE — Progress Notes (Signed)
Office Note     CC:  follow up Requesting Provider:  Pleas Koch, NP  HPI: Jerome Thomas is a 56 y.o. (1965/12/10) male who presents for follow up after Angiogram. On 03/25/22 he underwent LLE arteriogram with left common and external iliac artery stenting by Dr. Trula Slade. This was performed secondary to LLE disabling claudication. He now reports no pain on ambulation in his left leg. No pain at rest and no tissue loss  The pt is not on a statin for cholesterol management due to intolerance.  The pt is not on a daily aspirin.   Other AC:  Plavix The pt is not on medication for hypertension.   The pt is not diabetic. Tobacco hx:  current  Past Medical History:  Diagnosis Date   Allergy    Avascular necrosis (HCC)    right hip   COPD (chronic obstructive pulmonary disease) (HCC)    Genital warts     Past Surgical History:  Procedure Laterality Date   ABDOMINAL AORTOGRAM W/LOWER EXTREMITY N/A 03/25/2022   Procedure: ABDOMINAL AORTOGRAM W/LOWER EXTREMITY;  Surgeon: Serafina Mitchell, MD;  Location: [facility];  Service: Cardiovascular;  Laterality: N/A;   ARTHROSCOPY KNEE W/ DRILLING  1980's   left knee   HERNIA REPAIR  yrs ago   x 2   PERIPHERAL VASCULAR INTERVENTION Left 03/25/2022   Procedure: PERIPHERAL VASCULAR INTERVENTION;  Surgeon: Serafina Mitchell, MD;  Location: [facility];  Service: Cardiovascular;  Laterality: Left;   TOTAL HIP ARTHROPLASTY  05/30/2011   Procedure: TOTAL HIP ARTHROPLASTY ANTERIOR APPROACH;  Surgeon: Mcarthur Rossetti;  Location: [facility];  Service: Orthopedics;  Laterality: Right;  Right Total Hip Arthroplasty, Direct Anterior Approach (C-Arm)    Social History   Socioeconomic History   Marital status: Married    Spouse name: Not on file   Number of children: Not on file   Years of education: Not on file   Highest education level: Not on file  Occupational History   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   1 child.    Works as a Dealer.   Enjoys riding 4-wheelers.    Social Determinants of Health   Financial Resource Strain: Not on file  Food Insecurity: Not on file  Transportation Needs: Not on file  Physical Activity: Not on file  Stress: Not on file  Social Connections: Not on file  Intimate Partner Violence: Not on file    Family History  Problem Relation Age of Onset   Arthritis Father    Colon cancer Neg Hx    Esophageal cancer Neg Hx    Liver cancer Neg Hx    Pancreatic cancer Neg Hx    Rectal cancer Neg Hx    Stomach cancer Neg Hx    Prostate cancer Neg Hx     Current Outpatient Medications  Medication Sig Dispense Refill   clopidogrel (PLAVIX) 75 MG tablet Take 1 tablet (75 mg total) by mouth daily. 30 tablet 11   gabapentin (NEURONTIN) 300 MG capsule Take 1 capsule (300 mg total) by mouth 2 (two) times daily. (Patient taking differently: Take 600 mg by mouth 2 (two) times daily.) 60 capsule 3   aspirin EC 81 MG tablet Take 1 tablet (81 mg total) by mouth daily. Swallow whole. (Patient not taking: Reported on 04/28/2022) 150 tablet 2   cilostazol (PLETAL) 100 MG tablet Take 1 tablet (100 mg total) by mouth 2 (two) times daily before a meal. (Patient not taking: Reported on 03/19/2022) 60 tablet 11   nicotine (NICODERM CQ - DOSED IN MG/24 HOURS) 21 mg/24hr patch Place 21 mg onto the skin daily. (Patient not taking: Reported on 04/28/2022)     rosuvastatin (CRESTOR) 10 MG tablet Take 1 tablet (10 mg total) by  mouth daily. (Patient not taking: Reported on 04/28/2022) 30 tablet 11   No current facility-administered medications for this visit.    No Known Allergies   REVIEW OF SYSTEMS:  '[X]'$  denotes positive finding, '[ ]'$  denotes negative finding Cardiac  Comments:  Chest pain or chest pressure:    Shortness of breath upon exertion:    Short of breath when lying flat:    Irregular heart rhythm:        Vascular    Pain in calf, thigh, or hip brought on by ambulation:    Pain in feet at night that wakes you up from your sleep:     Blood clot in your veins:    Leg swelling:         Pulmonary    Oxygen at home:    Productive cough:     Wheezing:         Neurologic    Sudden weakness in arms or legs:     Sudden numbness in arms or legs:     Sudden onset of difficulty speaking or slurred speech:    Temporary loss of vision in one eye:     Problems with dizziness:         Gastrointestinal    Blood in stool:     Vomited blood:         Genitourinary    Burning when urinating:     Blood in urine:        Psychiatric    Major depression:         Hematologic    Bleeding problems:    Problems with blood clotting too easily:        Skin    Rashes or ulcers:        Constitutional    Fever or chills:      PHYSICAL EXAMINATION:  Vitals:   04/28/22 0823  BP: 125/84  Pulse: 67  Resp: 16  Temp: 98 F (36.7 C)  TempSrc: Temporal  SpO2: 94%  Weight: (!) 327 lb (148.3 kg)  Height: '6\' 4"'$  (1.93 m)    General:  WDWN in NAD; vital signs documented above Gait: Normal HENT: WNL, normocephalic Pulmonary: normal non-labored breathing  Cardiac: regular HR, without  Murmurs without carotid bruit Abdomen: soft, ND Vascular Exam/Pulses:  Right Left  Radial 2+ (normal) 2+ (normal)   Femoral 2+ (normal) 2+ (normal)  Popliteal Not palpable Not palpable  DP 2+ (normal) Not palpable  PT 2+ (normal) 2+ (normal)   Extremities: without ischemic changes, without Gangrene , without  cellulitis; without  open wounds;  Musculoskeletal: no muscle wasting or atrophy  Neurologic: A&O X 3;  No focal weakness or paresthesias are detected Psychiatric:  The pt has Normal affect.   Non-Invasive Vascular Imaging:   VAS [place] Aorto/iliac Duplex:  Left Stent(s):  +----------------------+--------+--------------+---------+--------+  Left CIA and EIA stentPSV cm/sStenosis      Waveform Comments  +----------------------+--------+--------------+---------+--------+  Prox to Stent         73                    biphasic           +----------------------+--------+--------------+---------+--------+  Proximal Stent        174     1-49% stenosisbiphasic           +----------------------+--------+--------------+---------+--------+  Mid Stent             67                    biphasic           +----------------------+--------+--------------+---------+--------+  Distal Stent          83                    biphasic           +----------------------+--------+--------------+---------+--------+  Distal to Stent       168     30-49%        triphasic          +----------------------+--------+--------------+---------+--------+   Summary:  Stenosis: +-------------------+-------------+--------------+  Location           Stenosis     Stent           +-------------------+-------------+--------------+  Distal Aorta       <50% stenosis                +-------------------+-------------+--------------+  Left Common Iliac               1-49% stenosis  +-------------------+-------------+--------------+  Left External Iliac<50% stenosis1-49% stenosis  +-------------------+-------------+--------------+    ASSESSMENT/PLAN:: 56 y.o. male here for follow up after Angiogram. On 03/25/22 he underwent LLE arteriogram with left common and external iliac artery stenting by Dr. Trula Slade. This was performed secondary to LLE disabling claudication. His symptoms are now  resolved. Duplex today shows patent left common and external iliac artery stent - Encourage smoking cessation  - Restart Aspirin 81 mg daily, continue Plavix 75 mg daily - Follow up in 6 months with ABI and Aorto/iliac Duplex   Karoline Caldwell, PA-C [facility] [phone]  Clinic MD:   Trula Slade

## 2022-04-28 ENCOUNTER — Ambulatory Visit (INDEPENDENT_AMBULATORY_CARE_PROVIDER_SITE_OTHER): Payer: 59 | Admitting: Physician Assistant

## 2022-04-28 ENCOUNTER — Ambulatory Visit (HOSPITAL_COMMUNITY)
Admission: RE | Admit: 2022-04-28 | Discharge: 2022-04-28 | Disposition: A | Discharge status: Home / Self Care | Payer: 59 | Source: Ambulatory Visit | Attending: Surgery | Admitting: Surgery

## 2022-04-28 VITALS — BP 125/84 | HR 67 | Temp 98.0°F | Resp 16 | Ht 76.0 in | Wt 327.0 lb

## 2022-04-28 DIAGNOSIS — I70213 Atherosclerosis of native arteries of extremities with intermittent claudication, bilateral legs: Secondary | ICD-10-CM | POA: Diagnosis not present

## 2022-04-28 DIAGNOSIS — I70223 Atherosclerosis of native arteries of extremities with rest pain, bilateral legs: Secondary | ICD-10-CM | POA: Diagnosis present

## 2022-05-02 ENCOUNTER — Other Ambulatory Visit: Payer: Self-pay

## 2022-05-02 DIAGNOSIS — I70213 Atherosclerosis of native arteries of extremities with intermittent claudication, bilateral legs: Secondary | ICD-10-CM

## 2022-08-19 ENCOUNTER — Encounter: Payer: Self-pay | Admitting: Family Medicine

## 2022-08-19 ENCOUNTER — Ambulatory Visit (INDEPENDENT_AMBULATORY_CARE_PROVIDER_SITE_OTHER): Payer: 59 | Admitting: Family Medicine

## 2022-08-19 VITALS — BP 122/74 | HR 74 | Temp 98.0°F | Ht 76.0 in | Wt 326.5 lb

## 2022-08-19 DIAGNOSIS — J441 Chronic obstructive pulmonary disease with (acute) exacerbation: Secondary | ICD-10-CM | POA: Diagnosis not present

## 2022-08-19 MED ORDER — ALBUTEROL SULFATE HFA 108 (90 BASE) MCG/ACT IN AERS: 2.0000 | INHALATION_SPRAY | Freq: Four times a day (QID) | RESPIRATORY_TRACT | 2 refills | Status: AC | PRN

## 2022-08-19 MED ORDER — AZITHROMYCIN 250 MG PO TABS: ORAL_TABLET | ORAL | 0 refills | Status: DC

## 2022-08-19 MED ORDER — PREDNISONE 20 MG PO TABS: ORAL_TABLET | ORAL | 0 refills | Status: DC

## 2022-08-19 MED ORDER — GUAIFENESIN-CODEINE 100-10 MG/5ML PO SYRP: 5.0000 mL | ORAL_SOLUTION | Freq: Every evening | ORAL | 0 refills | Status: DC | PRN

## 2022-08-19 NOTE — Progress Notes (Signed)
Patient ID: Jerome Thomas, male    DOB: 1966/05/25, 57 y.o.   MRN: [id number]  This visit was conducted in person.  BP 122/74   Pulse 74   Temp 98 F (36.7 C) (Temporal)   Ht '6\' 4"'$  (1.93 m)   Wt (!) 326 lb 8 oz (148.1 kg)   SpO2 94%   BMI 39.74 kg/m    CC:  Chief Complaint  Patient presents with   Cough    Symptoms started a week ago Saturday with vomiting  No Covid Test   Nasal Congestion   Sinusitis    Subjective:   HPI: Jerome Thomas is a 57 y.o. male presenting on 08/19/2022 for Cough (Symptoms started a week ago Saturday with vomiting /No Covid Test), Nasal Congestion, and Sinusitis   Date of onset:   1 week ago Initial symptoms included  emesis  off and on, followed by fatigue, body ache.  Nasal congestion.  No ST,  possible fever on day 1.  No diarrhea Symptoms progressed to increased congestion and sinus pressure and pain. Wakes at night headache and left eye pain. No ear pain.  No further emesis, keep up with liquids.    Has had some increase in SOb and wheeze, chest congestion.wheeze.  Sick contacts:  none COVID testing:   none    He has tried to treat with  mucinex and nyquil      Has history of  COPD. Not on any controlled meds for this. Current smoker.      Relevant past medical, surgical, family and social history reviewed and updated as indicated. Interim medical history since our last visit reviewed. Allergies and medications reviewed and updated. Outpatient Medications Prior to Visit  Medication Sig Dispense Refill   clopidogrel (PLAVIX) 75 MG tablet Take 1 tablet (75 mg total) by mouth daily. 30 tablet 11   cilostazol (PLETAL) 100 MG tablet Take 1 tablet (100 mg total) by mouth 2 (two) times daily before a meal. (Patient not taking: Reported on 03/19/2022) 60 tablet 11   aspirin EC 81 MG tablet Take 1 tablet (81 mg total) by mouth daily. Swallow whole. (Patient not taking: Reported on 04/28/2022) 150 tablet 2   gabapentin (NEURONTIN) 300  MG capsule Take 1 capsule (300 mg total) by mouth 2 (two) times daily. (Patient taking differently: Take 600 mg by mouth 2 (two) times daily.) 60 capsule 3   nicotine (NICODERM CQ - DOSED IN MG/24 HOURS) 21 mg/24hr patch Place 21 mg onto the skin daily. (Patient not taking: Reported on 04/28/2022)     rosuvastatin (CRESTOR) 10 MG tablet Take 1 tablet (10 mg total) by mouth daily. (Patient not taking: Reported on 04/28/2022) 30 tablet 11   No facility-administered medications prior to visit.     Per HPI unless specifically indicated in ROS section below Review of Systems  Constitutional:  Negative for fatigue and fever.  HENT:  Positive for congestion and sinus pressure. Negative for ear pain.   Eyes:  Negative for pain.  Respiratory:  Positive for cough, shortness of breath and wheezing.   Cardiovascular:  Negative for chest pain, palpitations and leg swelling.  Gastrointestinal:  Negative for abdominal pain.  Genitourinary:  Negative for dysuria.  Musculoskeletal:  Negative for arthralgias.  Neurological:  Negative for syncope, light-headedness and headaches.  Psychiatric/Behavioral:  Negative for dysphoric mood.    Objective:  BP 122/74   Pulse 74   Temp 98 F (36.7 C) (Temporal)   Ht  $'6\' 4"'o$  (1.93 m)   Wt (!) 326 lb 8 oz (148.1 kg)   SpO2 94%   BMI 39.74 kg/m   Wt Readings from Last 3 Encounters:  08/19/22 (!) 326 lb 8 oz (148.1 kg)  04/28/22 (!) 327 lb (148.3 kg)  03/25/22 300 lb (136.1 kg)      Physical Exam Constitutional:      General: He is not in acute distress.    Appearance: Normal appearance. He is well-developed. He is not ill-appearing or toxic-appearing.  HENT:     Head: Normocephalic and atraumatic.     Right Ear: Hearing, tympanic membrane, ear canal and external ear normal. No tenderness. No foreign body. Tympanic membrane is not retracted or bulging.     Left Ear: Hearing, tympanic membrane, ear canal and external ear normal. No tenderness. No foreign body.  Tympanic membrane is not retracted or bulging.     Nose: Mucosal edema and congestion present. No rhinorrhea.     Right Sinus: No maxillary sinus tenderness or frontal sinus tenderness.     Left Sinus: No maxillary sinus tenderness or frontal sinus tenderness.     Mouth/Throat:     Dentition: Normal dentition. No dental caries.     Pharynx: Uvula midline. Posterior oropharyngeal erythema present. No oropharyngeal exudate.     Tonsils: No tonsillar abscesses.  Eyes:     General: Lids are normal. Lids are everted, no foreign bodies appreciated.     Conjunctiva/sclera: Conjunctivae normal.     Pupils: Pupils are equal, round, and reactive to light.  Neck:     Thyroid: No thyroid mass or thyromegaly.     Vascular: No carotid bruit.     Trachea: Trachea and phonation normal.  Cardiovascular:     Rate and Rhythm: Normal rate and regular rhythm.     Pulses: Normal pulses.     Heart sounds: Normal heart sounds, S1 normal and S2 normal. No murmur heard.    No gallop.  Pulmonary:     Effort: Pulmonary effort is normal. No respiratory distress.     Breath sounds: Normal breath sounds. Decreased air movement present. No decreased breath sounds, wheezing, rhonchi or rales.  Abdominal:     General: Bowel sounds are normal.     Palpations: Abdomen is soft.     Tenderness: There is no abdominal tenderness. There is no guarding or rebound.     Hernia: No hernia is present.  Musculoskeletal:     Cervical back: Normal range of motion and neck supple.  Skin:    General: Skin is warm and dry.     Findings: No rash.  Neurological:     Mental Status: He is alert.     Deep Tendon Reflexes: Reflexes are normal and symmetric.  Psychiatric:        Speech: Speech normal.        Behavior: Behavior normal.        Judgment: Judgment normal.       Results for orders placed or performed during the hospital encounter of 03/25/22  I-STAT, chem 8  Result Value Ref Range   Sodium 140 135 - 145 mmol/L    Potassium 4.1 3.5 - 5.1 mmol/L   Chloride 103 98 - 111 mmol/L   BUN 14 6 - 20 mg/dL   Creatinine, Ser 0.80 0.61 - 1.24 mg/dL   Glucose, Bld 99 70 - 99 mg/dL   Calcium, Ion 1.17 1.15 - 1.40 mmol/L   TCO2 27 22 - 32  mmol/L   Hemoglobin 17.0 13.0 - 17.0 g/dL   HCT 50.0 39.0 - 52.0 %  POCT Activated clotting time  Result Value Ref Range   Activated Clotting Time 263 seconds    Assessment and Plan  COPD exacerbation ([place]) Assessment & Plan: Acute worsening of COPD with likely initial viral infection now with bacterial superinfection.  Given chest tightness and shortness of breath will treat with prednisone taper.  Will also treat for bacterial superinfection with azithromycin.  He states Mucinex and NyQuil are not helping all that much with his bedtime cough.  We will try cough suppressant syrup at night with codeine as needed.  He can continue using Mucinex during the day.  I have also provided an inhaler for albuterol if he feels like he has chest tightness or wheeze or shortness of breath he can use 2 puffs inhaled every 4-6 hours.  Return and ER precautions provided.   Other orders -     guaiFENesin-Codeine; Take 5-10 mLs by mouth at bedtime as needed for cough.  Dispense: 180 mL; Refill: 0 -     Azithromycin; 2 tab po x 1 day then 1 tab po daily  Dispense: 6 tablet; Refill: 0 -     predniSONE; 3 tabs by mouth daily x 3 days, then 2 tabs by mouth daily x 2 days then 1 tab by mouth daily x 2 days  Dispense: 15 tablet; Refill: 0 -     Albuterol Sulfate HFA; Inhale 2 puffs into the lungs every 6 (six) hours as needed for wheezing or shortness of breath.  Dispense: 8 g; Refill: 2    Return if symptoms worsen or fail to improve.   Eliezer Lofts, MD

## 2022-08-19 NOTE — Assessment & Plan Note (Signed)
Acute worsening of COPD with likely initial viral infection now with bacterial superinfection.  Given chest tightness and shortness of breath will treat with prednisone taper.  Will also treat for bacterial superinfection with azithromycin.  He states Mucinex and NyQuil are not helping all that much with his bedtime cough.  We will try cough suppressant syrup at night with codeine as needed.  He can continue using Mucinex during the day.  I have also provided an inhaler for albuterol if he feels like he has chest tightness or wheeze or shortness of breath he can use 2 puffs inhaled every 4-6 hours.  Return and ER precautions provided.

## 2022-08-26 ENCOUNTER — Other Ambulatory Visit: Payer: Self-pay | Admitting: Family Medicine

## 2022-08-26 NOTE — Telephone Encounter (Signed)
Last office visit 08/19/2022 for COPD exacerbation.  Last refilled 08/19/2022 for 180 ml with no refills.  Next Appt: No future appointments.

## 2022-11-17 ENCOUNTER — Encounter: Payer: Self-pay | Admitting: Physician Assistant

## 2022-11-17 ENCOUNTER — Ambulatory Visit (INDEPENDENT_AMBULATORY_CARE_PROVIDER_SITE_OTHER): Payer: 59 | Admitting: Physician Assistant

## 2022-11-17 ENCOUNTER — Ambulatory Visit (INDEPENDENT_AMBULATORY_CARE_PROVIDER_SITE_OTHER)
Admission: RE | Admit: 2022-11-17 | Discharge: 2022-11-17 | Disposition: A | Discharge status: Home / Self Care | Payer: 59 | Source: Ambulatory Visit | Attending: Surgery | Admitting: Surgery

## 2022-11-17 ENCOUNTER — Ambulatory Visit (HOSPITAL_COMMUNITY)
Admission: RE | Admit: 2022-11-17 | Discharge: 2022-11-17 | Disposition: A | Discharge status: Home / Self Care | Payer: 59 | Source: Ambulatory Visit | Attending: Surgery | Admitting: Surgery

## 2022-11-17 VITALS — BP 128/84 | HR 66 | Temp 98.1°F | Resp 20 | Ht 76.0 in | Wt 338.0 lb

## 2022-11-17 DIAGNOSIS — I70213 Atherosclerosis of native arteries of extremities with intermittent claudication, bilateral legs: Secondary | ICD-10-CM

## 2022-11-17 LAB — VAS US ABI WITH/WO TBI
Left ABI: 1.17
Right ABI: 1.16

## 2022-11-17 NOTE — Progress Notes (Signed)
[facility] [place] HISTORY AND PHYSICAL   History of Present Illness:  Patient is a 57 y.o. year old male who presents for evaluation of PAD with history of rest pain s/p Angiogram. On 03/25/22 he underwent LLE arteriogram with left common and external iliac artery stenting by Dr. Myra Gianotti.   He works full time and is on his feet more than half the work day.  He states he had to walk more over the weekend and had hip pain.  He states he has OA of the hips.    The pt is on a statin for cholesterol management.  The pt is not on a daily aspirin.   Other AC:  Plavix The pt is not on medication for hypertension.   The pt is not diabetic. Tobacco hx:  current   Past Medical History:  Diagnosis Date   Allergy    Avascular necrosis (HCC)    right hip   COPD (chronic obstructive pulmonary disease) (HCC)    Genital warts     Past Surgical History:  Procedure Laterality Date   ABDOMINAL AORTOGRAM W/LOWER EXTREMITY N/A 03/25/2022   Procedure: ABDOMINAL AORTOGRAM W/LOWER EXTREMITY;  Surgeon: Nada Libman, MD;  Location: [facility];  Service: Cardiovascular;  Laterality: N/A;   ARTHROSCOPY KNEE W/ DRILLING  1980's   left knee   HERNIA REPAIR  yrs ago   x 2   PERIPHERAL VASCULAR INTERVENTION Left 03/25/2022   Procedure: PERIPHERAL VASCULAR INTERVENTION;  Surgeon: Nada Libman, MD;  Location: [facility];  Service: Cardiovascular;  Laterality: Left;   TOTAL HIP ARTHROPLASTY  05/30/2011   Procedure: TOTAL HIP ARTHROPLASTY ANTERIOR APPROACH;  Surgeon: Kathryne Hitch;  Location: [facility];  Service: Orthopedics;  Laterality: Right;  Right Total Hip Arthroplasty, Direct Anterior Approach (C-Arm)    ROS:   General:  No weight loss, Fever, chills  HEENT: No recent headaches, no nasal bleeding, no visual changes, no sore throat  Neurologic: No dizziness, blackouts, seizures. No recent symptoms of stroke or mini- stroke. No recent episodes of  slurred speech, or temporary blindness.  Cardiac: No recent episodes of chest pain/pressure, no shortness of breath at rest.  No shortness of breath with exertion.  Denies history of atrial fibrillation or irregular heartbeat  Vascular: No history of rest pain in feet.  No history of claudication.  No history of non-healing ulcer, No history of DVT   Pulmonary: No home oxygen, no productive cough, no hemoptysis,  No asthma or wheezing  Musculoskeletal:  [ ]  Arthritis, [ ]  Low back pain,  [ ]  Joint pain  Hematologic:No history of hypercoagulable state.  No history of easy bleeding.  No history of anemia  Gastrointestinal: No hematochezia or melena,  No gastroesophageal reflux, no trouble swallowing  Urinary: [ ]  chronic Kidney disease, [ ]  on HD - [ ]  MWF or [ ]  TTHS, [ ]  Burning with urination, [ ]  Frequent urination, [ ]  Difficulty urinating;   Skin: No rashes  Psychological: No history of anxiety,  No history of depression  Social History Social History   Tobacco Use   Smoking status: Every Day    Packs/day: 0.50    Years: 30.00    Additional pack years: 0.00    Total pack years: 15.00    Types: Cigarettes   Smokeless tobacco: Never   Tobacco comments:    Using Nicotine patches  Vaping Use   Vaping Use: Former  Substance Use Topics  Alcohol use: Yes    Comment: occasional   Drug use: No    Family History Family History  Problem Relation Age of Onset   Arthritis Father    Colon cancer Neg Hx    Esophageal cancer Neg Hx    Liver cancer Neg Hx    Pancreatic cancer Neg Hx    Rectal cancer Neg Hx    Stomach cancer Neg Hx    Prostate cancer Neg Hx     Allergies  No Known Allergies   Current Outpatient Medications  Medication Sig Dispense Refill   albuterol (VENTOLIN HFA) 108 (90 Base) MCG/ACT inhaler Inhale 2 puffs into the lungs every 6 (six) hours as needed for wheezing or shortness of breath. 8 g 2   clopidogrel (PLAVIX) 75 MG tablet Take 1 tablet (75 mg  total) by mouth daily. 30 tablet 11   rosuvastatin (CRESTOR) 10 MG tablet Take 10 mg by mouth daily.     No current facility-administered medications for this visit.    Physical Examination  Vitals:   11/17/22 0953  BP: 128/84  Pulse: 66  Resp: 20  Temp: 98.1 F (36.7 C)  SpO2: 93%  Weight: (!) 338 lb (153.3 kg)  Height: 6\' 4"  (1.93 m)    Body mass index is 41.14 kg/m.  General:  Alert and oriented, no acute distress HEENT: Normal Neck: No bruit or JVD Pulmonary: Clear to auscultation bilaterally Cardiac: Regular Rate and Rhythm without murmur Abdomen: Soft, non-tender, non-distended, no mass, no scars Skin: No rash Extremity Pulses:  2+ radial, brachial, femoral, dorsalis pedis, posterior tibial pulses bilaterally Musculoskeletal: No deformity or edema  Neurologic: Upper and lower extremity motor 5/5 and symmetric  DATA:   Abdominal Aorta Findings:  +--------+-------+----------+----------+--------+--------+--------+  LocationAP (cm)Trans (cm)PSV (cm/s)WaveformThrombusComments  +--------+-------+----------+----------+--------+--------+--------+  Distal                  23        biphasic                  +--------+-------+----------+----------+--------+--------+--------+     Left Stent(s): EIA  +---------------+--------+--------+---------+--------+  EIA           PSV cm/sStenosisWaveform Comments  +---------------+--------+--------+---------+--------+  Prox to Stent  132             triphasic          +---------------+--------+--------+---------+--------+  Proximal Stent 110             triphasic          +---------------+--------+--------+---------+--------+  Mid Stent      117             triphasic          +---------------+--------+--------+---------+--------+  Distal Stent   99              triphasic          +---------------+--------+--------+---------+--------+  Distal to Stent96              triphasic           +---------------+--------+--------+---------+--------+   Right CFA 86 cm/s triphasic waveform, Left CFA 75 cm/s triphasic waveform   ABI Findings:  +---------+------------------+-----+---------+--------+  Right   Rt Pressure (mmHg)IndexWaveform Comment   +---------+------------------+-----+---------+--------+  Brachial 134                                       +---------+------------------+-----+---------+--------+  PTA     162               1.16 triphasic          +---------+------------------+-----+---------+--------+  DP      135               0.96 biphasic           +---------+------------------+-----+---------+--------+  Great Toe105               0.75 Normal             +---------+------------------+-----+---------+--------+   +---------+------------------+-----+---------+-------+  Left    Lt Pressure (mmHg)IndexWaveform Comment  +---------+------------------+-----+---------+-------+  Brachial 140                                      +---------+------------------+-----+---------+-------+  PTA     164               1.17 triphasic         +---------+------------------+-----+---------+-------+  DP      149               1.06 biphasic          +---------+------------------+-----+---------+-------+  Great Toe147               1.05 Normal            +---------+------------------+-----+---------+-------+   +-------+-----------+-----------+------------+------------+  ABI/TBIToday's ABIToday's TBIPrevious ABIPrevious TBI  +-------+-----------+-----------+------------+------------+  Right 1.16       0.75       1.12        0.72          +-------+-----------+-----------+------------+------------+  Left  1.17       1.05       0.95        0.76          +-------+-----------+-----------+------------+------------+      Bilateral ABIs appear essentially unchanged.    Summary:  Right: Resting right  ankle-brachial index is within normal range. The  right toe-brachial index is normal.   Left: Resting left ankle-brachial index is within normal range. The left  toe-brachial index is normal.   ASSESSMENT/PLAN:  57 y.o. male here for follow up after Angiogram. On 03/25/22 he underwent LLE arteriogram with left common and external iliac artery stenting by Dr. Myra Gianotti. This was performed secondary to LLE disabling claudication.   His ABI's have improved on the left and the duplex show patent stents with triphasic flow.  He denies rest pain, non healing wounds or short distance claudication.  He will start a walking exercise program to increase his distance.  I will schedule a f/u in 6 months.  If he remains asymptomatic he can then be seen yearly.        Mosetta Pigeon PA-C [facility] [place] Office: [phone]  MD on call Randie Heinz

## 2022-11-18 ENCOUNTER — Other Ambulatory Visit: Payer: Self-pay

## 2022-11-18 DIAGNOSIS — I70223 Atherosclerosis of native arteries of extremities with rest pain, bilateral legs: Secondary | ICD-10-CM

## 2022-11-18 DIAGNOSIS — I70213 Atherosclerosis of native arteries of extremities with intermittent claudication, bilateral legs: Secondary | ICD-10-CM

## 2023-03-21 ENCOUNTER — Other Ambulatory Visit: Payer: Self-pay | Admitting: Surgery

## 2023-05-25 ENCOUNTER — Ambulatory Visit (INDEPENDENT_AMBULATORY_CARE_PROVIDER_SITE_OTHER)
Admission: RE | Admit: 2023-05-25 | Discharge: 2023-05-25 | Disposition: A | Discharge status: Home / Self Care | Payer: 59 | Source: Ambulatory Visit | Attending: Surgery | Admitting: Surgery

## 2023-05-25 ENCOUNTER — Ambulatory Visit (HOSPITAL_COMMUNITY)
Admission: RE | Admit: 2023-05-25 | Discharge: 2023-05-25 | Disposition: A | Discharge status: Home / Self Care | Payer: 59 | Source: Ambulatory Visit | Attending: Surgery | Admitting: Surgery

## 2023-05-25 ENCOUNTER — Ambulatory Visit (INDEPENDENT_AMBULATORY_CARE_PROVIDER_SITE_OTHER): Payer: 59 | Admitting: Physician Assistant

## 2023-05-25 VITALS — BP 127/81 | HR 78 | Temp 98.4°F | Ht 76.0 in | Wt 344.0 lb

## 2023-05-25 DIAGNOSIS — I739 Peripheral vascular disease, unspecified: Secondary | ICD-10-CM

## 2023-05-25 DIAGNOSIS — I70213 Atherosclerosis of native arteries of extremities with intermittent claudication, bilateral legs: Secondary | ICD-10-CM

## 2023-05-25 DIAGNOSIS — I70223 Atherosclerosis of native arteries of extremities with rest pain, bilateral legs: Secondary | ICD-10-CM

## 2023-05-25 LAB — VAS US ABI WITH/WO TBI
Left ABI: 1.09
Right ABI: 1.07

## 2023-05-25 MED ORDER — ROSUVASTATIN CALCIUM 10 MG PO TABS: 10.0000 mg | ORAL_TABLET | Freq: Every day | ORAL | 5 refills | Status: DC

## 2023-05-25 NOTE — Progress Notes (Signed)
Office Note   History of Present Illness   Jerome Thomas is a 57 y.o. (06-13-1966) male who presents for surveillance of PAD.  He has a history of left external and common iliac artery stenting by Dr. Myra Gianotti in October 2023.   He returns today for follow up. He states things have been going well since his last visit. He denies any claudication, rest pain, or tissue loss of the lower extremities. He is still dealing with bilateral hip osteoarthritis and plans on seeing his orthopedic doctor soon.  He ran out of his statin a few weeks ago. He has not seen his PCP recently to get a lipid panel.  Current Outpatient Medications  Medication Sig Dispense Refill   albuterol (VENTOLIN HFA) 108 (90 Base) MCG/ACT inhaler Inhale 2 puffs into the lungs every 6 (six) hours as needed for wheezing or shortness of breath. 8 g 2   clopidogrel (PLAVIX) 75 MG tablet Take 1 tablet by mouth once daily 30 tablet 11   rosuvastatin (CRESTOR) 10 MG tablet Take 1 tablet by mouth once daily (Patient not taking: Reported on 05/25/2023) 30 tablet 0   No current facility-administered medications for this visit.    REVIEW OF SYSTEMS (negative unless checked):   Cardiac:  []  Chest pain or chest pressure? []  Shortness of breath upon activity? []  Shortness of breath when lying flat? []  Irregular heart rhythm?  Vascular:  []  Pain in calf, thigh, or hip brought on by walking? []  Pain in feet at night that wakes you up from your sleep? []  Blood clot in your veins? []  Leg swelling?  Pulmonary:  []  Oxygen at home? []  Productive cough? []  Wheezing?  Neurologic:  []  Sudden weakness in arms or legs? []  Sudden numbness in arms or legs? []  Sudden onset of difficult speaking or slurred speech? []  Temporary loss of vision in one eye? []  Problems with dizziness?  Gastrointestinal:  []  Blood in stool? []  Vomited blood?  Genitourinary:  []  Burning when urinating? []  Blood in urine?  Psychiatric:  []   Major depression  Hematologic:  []  Bleeding problems? []  Problems with blood clotting?  Dermatologic:  []  Rashes or ulcers?  Constitutional:  []  Fever or chills?  Ear/Nose/Throat:  []  Change in hearing? []  Nose bleeds? []  Sore throat?  Musculoskeletal:  []  Back pain? [x]  Joint pain? []  Muscle pain?   Physical Examination   Vitals:   05/25/23 0916  BP: 127/81  Pulse: 78  Temp: 98.4 F (36.9 C)  TempSrc: Temporal  SpO2: 92%  Weight: (!) 344 lb (156 kg)  Height: 6\' 4"  (1.93 m)   Body mass index is 41.87 kg/m.  General:  WDWN in NAD; vital signs documented above Gait: Not observed HENT: WNL, normocephalic Pulmonary: normal non-labored breathing , without rales, rhonchi,  wheezing Cardiac: regular Abdomen: soft, NT, no masses Skin: without rashes Vascular Exam/Pulses: 2+ PT pulses bilaterally Extremities: without ischemic changes, without gangrene , without cellulitis; without open wounds;  Musculoskeletal: no muscle wasting or atrophy  Neurologic: A&O X 3;  No focal weakness or paresthesias are detected Psychiatric:  The pt has Normal affect.  Non-Invasive Vascular imaging   ABI (05/25/2023) +-------+-----------+-----------+------------+------------+  ABI/TBIToday's ABIToday's TBIPrevious ABIPrevious TBI  +-------+-----------+-----------+------------+------------+  Right 1.07       0.71       1.16        0.75          +-------+-----------+-----------+------------+------------+  Left  1.09       1.15  1.17        1.05          +-------+-----------+-----------+------------+------------+   Aortoiliac Duplex (05/25/2023) Patent left iliac stent without stenosis   Medical Decision Making   Jerome Thomas is a 57 y.o. male who presents for surveillance of PAD  Based on the patient's vascular studies, his ABIs are essentially unchanged since last visit.  His right ABI is 1.07 and left ABI is 1.09 Aortoiliac duplex demonstrates patent  left common and external iliac artery stents without stenosis He denies any claudication, rest pain, or tissue loss.  He has palpable PT pulses bilaterally He states he ran out of his statin about 3 weeks ago and has no more refills.  We originally prescribed the patient this medication with intentions for him to get future refills from his PCP.  I discussed with the patient that I will send him in a 72-month refill of his current statin.  He needs to get an updated lipid panel with his PCP and future management/refills can come from their office. He can follow-up with our office in 1 year with repeat ABIs and left aortoiliac duplex   Loel Dubonnet PA-C [facility] [place] Office: [phone]  Clinic MD: Myra Gianotti

## 2023-05-27 ENCOUNTER — Encounter: Payer: Self-pay | Admitting: Orthopaedic Surgery

## 2023-05-27 ENCOUNTER — Ambulatory Visit: Payer: 59 | Admitting: Orthopaedic Surgery

## 2023-05-27 ENCOUNTER — Other Ambulatory Visit (INDEPENDENT_AMBULATORY_CARE_PROVIDER_SITE_OTHER): Payer: 59

## 2023-05-27 ENCOUNTER — Other Ambulatory Visit: Payer: Self-pay

## 2023-05-27 VITALS — Ht 76.0 in | Wt 345.0 lb

## 2023-05-27 DIAGNOSIS — I739 Peripheral vascular disease, unspecified: Secondary | ICD-10-CM

## 2023-05-27 DIAGNOSIS — G8929 Other chronic pain: Secondary | ICD-10-CM

## 2023-05-27 DIAGNOSIS — I70213 Atherosclerosis of native arteries of extremities with intermittent claudication, bilateral legs: Secondary | ICD-10-CM

## 2023-05-27 DIAGNOSIS — M25562 Pain in left knee: Secondary | ICD-10-CM | POA: Diagnosis not present

## 2023-05-27 DIAGNOSIS — Z96641 Presence of right artificial hip joint: Secondary | ICD-10-CM

## 2023-05-27 DIAGNOSIS — M25552 Pain in left hip: Secondary | ICD-10-CM

## 2023-05-27 DIAGNOSIS — M7061 Trochanteric bursitis, right hip: Secondary | ICD-10-CM | POA: Diagnosis not present

## 2023-05-27 DIAGNOSIS — I70223 Atherosclerosis of native arteries of extremities with rest pain, bilateral legs: Secondary | ICD-10-CM

## 2023-05-27 MED ORDER — LIDOCAINE HCL 1 % IJ SOLN
3.0000 mL | INTRAMUSCULAR | Status: AC | PRN
  Administered 2023-05-27: 3 mL

## 2023-05-27 MED ORDER — METHYLPREDNISOLONE ACETATE 40 MG/ML IJ SUSP
40.0000 mg | INTRAMUSCULAR | Status: AC | PRN
  Administered 2023-05-27: 40 mg via INTRA_ARTICULAR

## 2023-05-27 NOTE — Progress Notes (Signed)
The patient is a 57 year old gentleman that we replaced his right hip back in 2012.  We have not seen him in a long period of time.  He comes in with his wife today with left hip pain as well as right hip pain.  He said the right hip is more severe but is the lateral aspect of his hip where he points to as well as his low back on the right side.  He denies any groin pain.  He is also been dealing with left knee pain for many years.  He had surgery on that knee he says when he was 57 years old.  He has had stents for lower extremity claudication so he is on Plavix.  He does take Crestor as well.  His other significant issue is that he is morbidly obese with a BMI of almost 42.  His wife would like to get him to his primary care physician to work on weight loss.  He is a prediabetic.  He denies any specific injuries.  He works on his feet all day long.  Examination of his right and left hip show the moves smoothly and fluidly.  His pain is over the trochanteric area of the right hip.  There is a aspect of low back pain as well.  An AP pelvis shows a normal left hip and a well-seated right total hip arthroplasty with no complicating features.  Examination of his left knee shows patellofemoral crepitation and medial joint line tenderness.  There is good range of motion of the knee and it feels ligamentously stable.  X-rays of the left knee shows tricompartment arthritis.  It is moderate.  I did recommend today a steroid injection of his right hip trochanteric area and his left knee.  I would like him to try Voltaren gel over his hips because that has low systemic absorption for an NSAID with him being on Plavix.  He does understand the crucial aspect of his weight and how taking weight off his frame will help his joints overall.  I would also like to set him up for outpatient physical therapy for any modalities that therapist can try to help with his hip pain, his right sided low back pain and knee pain.  We  will then see him back after course of physical therapy.  Of note he did tolerate the steroid injections well that we provided over his right hip trochanteric area and his left knee.     Procedure Note  Patient: Jerome Thomas             Date of Birth: [birth year]           MRN: [id number]             Visit Date: 05/27/2023  Procedures: Visit Diagnoses:  1. Chronic pain of left knee   2. Pain of left hip   3. History of total right hip replacement   4. Severe obesity (BMI >= 40) (HCC)   5. Trochanteric bursitis, right hip     Large Joint Inj: L knee on 05/27/2023 2:15 PM Indications: diagnostic evaluation and pain Details: 22 G 1.5 in needle, superolateral approach  Arthrogram: No  Medications: 3 mL lidocaine 1 %; 40 mg methylPREDNISolone acetate 40 MG/ML Outcome: tolerated well, no immediate complications Procedure, treatment alternatives, risks and benefits explained, specific risks discussed. Consent was given by the patient. Immediately prior to procedure a time out was called to verify the correct patient, procedure, equipment, support  staff and site/side marked as required. Patient was prepped and draped in the usual sterile fashion.    Large Joint Inj: R greater trochanter on 05/27/2023 2:15 PM Indications: pain and diagnostic evaluation Details: 22 G 1.5 in needle, lateral approach  Arthrogram: No  Medications: 3 mL lidocaine 1 %; 40 mg methylPREDNISolone acetate 40 MG/ML Outcome: tolerated well, no immediate complications Procedure, treatment alternatives, risks and benefits explained, specific risks discussed. Consent was given by the patient. Immediately prior to procedure a time out was called to verify the correct patient, procedure, equipment, support staff and site/side marked as required. Patient was prepped and draped in the usual sterile fashion.

## 2023-05-28 ENCOUNTER — Other Ambulatory Visit: Payer: Self-pay

## 2023-05-28 DIAGNOSIS — M25552 Pain in left hip: Secondary | ICD-10-CM

## 2023-05-28 DIAGNOSIS — Z96641 Presence of right artificial hip joint: Secondary | ICD-10-CM

## 2023-05-28 DIAGNOSIS — G8929 Other chronic pain: Secondary | ICD-10-CM

## 2023-05-28 DIAGNOSIS — M7061 Trochanteric bursitis, right hip: Secondary | ICD-10-CM

## 2023-06-19 ENCOUNTER — Ambulatory Visit (INDEPENDENT_AMBULATORY_CARE_PROVIDER_SITE_OTHER): Payer: 59 | Admitting: Rehabilitative and Restorative Service Providers"

## 2023-06-19 ENCOUNTER — Encounter: Payer: Self-pay | Admitting: Rehabilitative and Restorative Service Providers"

## 2023-06-19 DIAGNOSIS — R600 Localized edema: Secondary | ICD-10-CM

## 2023-06-19 DIAGNOSIS — M6281 Muscle weakness (generalized): Secondary | ICD-10-CM | POA: Diagnosis not present

## 2023-06-19 DIAGNOSIS — R262 Difficulty in walking, not elsewhere classified: Secondary | ICD-10-CM

## 2023-06-19 DIAGNOSIS — G8929 Other chronic pain: Secondary | ICD-10-CM

## 2023-06-19 DIAGNOSIS — M25551 Pain in right hip: Secondary | ICD-10-CM | POA: Diagnosis not present

## 2023-06-19 DIAGNOSIS — M25562 Pain in left knee: Secondary | ICD-10-CM

## 2023-06-19 DIAGNOSIS — M25552 Pain in left hip: Secondary | ICD-10-CM

## 2023-06-19 NOTE — Therapy (Signed)
 OUTPATIENT PHYSICAL THERAPY LOWER EXTREMITY EVALUATION   Patient Name: Jerome Thomas MRN: [id number] DOB:1965/11/19, 58 y.o., male Today's Date: 06/19/2023  END OF SESSION:  PT End of Session - 06/19/23 1653     Visit Number 1    Number of Visits 11    Date for PT Re-Evaluation 08/14/23    Progress Note Due on Visit 11    PT Start Time 0845    PT Stop Time 0930    PT Time Calculation (min) 45 min    Activity Tolerance Patient tolerated treatment well;No increased pain    Behavior During Therapy WFL for tasks assessed/performed             Past Medical History:  Diagnosis Date   Allergy    Avascular necrosis (HCC)    right hip   COPD (chronic obstructive pulmonary disease) (HCC)    Genital warts    Hyperlipidemia    Hypertension    Peripheral arterial disease (HCC)    Past Surgical History:  Procedure Laterality Date   ABDOMINAL AORTOGRAM W/LOWER EXTREMITY N/A 03/25/2022   Procedure: ABDOMINAL AORTOGRAM W/LOWER EXTREMITY;  Surgeon: Serene Gaile ORN, MD;  Location: [facility];  Service: Cardiovascular;  Laterality: N/A;   ARTHROSCOPY KNEE W/ DRILLING  1980's   left knee   HERNIA REPAIR  yrs ago   x 2   PERIPHERAL VASCULAR INTERVENTION Left 03/25/2022   Procedure: PERIPHERAL VASCULAR INTERVENTION;  Surgeon: Serene Gaile ORN, MD;  Location: [facility];  Service: Cardiovascular;  Laterality: Left;   TOTAL HIP ARTHROPLASTY  05/30/2011   Procedure: TOTAL HIP ARTHROPLASTY ANTERIOR APPROACH;  Surgeon: Lonni CINDERELLA Poli;  Location: [facility];  Service: Orthopedics;  Laterality: Right;  Right Total Hip Arthroplasty, Direct Anterior Approach (C-Arm)   Patient Active Problem List   Diagnosis Date Noted   Bilateral lower extremity edema 01/01/2022   Alopecia areata 08/04/2018   Hyperlipidemia 06/02/2018   Preventative health care 04/08/2017   COPD exacerbation (HCC) 10/05/2012   Snoring 10/05/2012   Tobacco use disorder 10/05/2012   Avascular necrosis  of right femoral head (HCC) 05/30/2011    PCP: Comer MARLA Gaskins, NP  REFERRING PROVIDER: Lonni CINDERELLA Poli, MD  REFERRING DIAG:  Diagnosis  4250895053 (ICD-10-CM) - Chronic pain of left knee  M25.552 (ICD-10-CM) - Pain of left hip  Z96.641 (ICD-10-CM) - History of total right hip replacement  E66.01 (ICD-10-CM) - Severe obesity (BMI >= 40) (HCC)  M70.61 (ICD-10-CM) - Trochanteric bursitis, right hip    THERAPY DIAG:  Difficulty in walking, not elsewhere classified  Localized edema  Muscle weakness (generalized)  Bilateral hip pain  Chronic pain of left knee  Rationale for Evaluation and Treatment: Rehabilitation  ONSET DATE: Over a year, saw Dr. Poli a few weeks ago due to gradual worsening  SUBJECTIVE:   SUBJECTIVE STATEMENT: [name] had a Rt THA 13 years ago.  Walking long distances and up stairs is particularly difficult difficult with both hips and the left knee.  He would like to have less pain at work, when walking and with stairs.  PERTINENT HISTORY: Rt THA in 2012, previous left knee scope, COPD, HLD, HTN, peripheral arterial disease  PAIN:  Are you having pain? Yes: NPRS scale: Left knee 2-4/10 and bilateral hips 2-4/10 Pain location: Left knee and both hips Pain description: Achy, sore, constant Aggravating factors: Walking and ascending stairs Relieving factors: Cortisone shot, take it easy  PRECAUTIONS: None  RED FLAGS: None   WEIGHT BEARING  RESTRICTIONS: No  FALLS:  Has patient fallen in last 6 months? No  LIVING ENVIRONMENT: Lives with: lives with their family and lives with their spouse Lives in: House/apartment Stairs:  Difficulty ascending Has following equipment at home: None  OCCUPATION: Heavy theatre stage manager  PLOF: Independent  PATIENT GOALS: Decrease pain, improve endurance and comfort with ascending stairs and prolonged walking  NEXT MD VISIT: As needed  OBJECTIVE:  Note: Objective measures were completed at  Evaluation unless otherwise noted.  DIAGNOSTIC FINDINGS: An AP pelvis and lateral both hips shows a well-seated right total hip  arthroplasty with no complicating features.  The left hip joint space is  well-maintained.  2 views of the left knee show tricompartment arthritis with medial and  lateral joint space narrowing as well as patellofemoral narrowing.  There  are osteophytes in all 3 compartments.    PATIENT SURVEYS:  FOTO 54 (Goal 62 in 11 visits)  COGNITION: Overall cognitive status: Within functional limits for tasks assessed     SENSATION: No complaints of peripheral pain or paresthesias  EDEMA:  Edema is noted of the left knee although no objective measures were taken  MUSCLE LENGTH: Hamstrings: Right 35 deg; Left 35 deg  LOWER EXTREMITY ROM:  Passive ROM Left/Right 06/19/2023   Hip flexion 75/75   Hip extension    Hip abduction    Hip adduction    Hip internal rotation 13/14   Hip external rotation 17/19   Knee flexion 129/132   Knee extension 0/0   Ankle dorsiflexion    Ankle plantarflexion    Ankle inversion    Ankle eversion     (Blank rows = not tested)  LOWER EXTREMITY MMT:  MMT out of 5 Left/Right 06/19/2023   Hip flexion    Hip extension    Hip abduction 4/4-   Hip adduction    Hip internal rotation    Hip external rotation    Knee flexion    Knee extension 4+/4   Ankle dorsiflexion    Ankle plantarflexion    Ankle inversion    Ankle eversion     (Blank rows = not tested)  GAIT: Distance walked: 100 feet Assistive device utilized: None Level of assistance: Complete Independence Comments: Ewald walks with a very stiff gait, particularly with the first few steps after standing.  He notes particular difficulty with prolonged walking and ascending stairs.  TREATMENT DATE:  06/19/2023    Supine lower trunk rotation 10 x 10 seconds Supine figure 4 stretch 2 x 20 seconds (hold next visit secondary to discomfort) Single knee-to-chest  stretch 4 x 20 seconds Supine quadriceps sets 2 sets of 10 for 5 seconds                                                                 Functional Activities:  Reviewed exam findings, day 1 home exercise program, discussed anatomy of the knee and hip and the importance of strengthening hip abductors and quadriceps musculature to reduce pain and allow candle to return to his previous level of function with less discomfort  PATIENT EDUCATION:  Education details: See above Person educated: Patient Education method: Explanation, Demonstration, Tactile cues, Verbal cues, and Handouts Education comprehension: verbalized understanding, returned demonstration, verbal cues required, tactile cues required,  and needs further education  HOME EXERCISE PROGRAM: Access Code: [facility] URL: [email or web address] Date: 06/19/2023 Prepared by: Lamar Ivory  Exercises - Single Knee to Chest Stretch  - 2-3 x daily - 7 x weekly - 1 sets - 5 reps - 20 seconds hold - Supine Lower Trunk Rotation  - 2-3 x daily - 7 x weekly - 1 sets - 10 reps - 10 seconds hold - Supine Quadriceps Sets  - 3-5 x daily - 7 x weekly - 2-3 sets - 10 reps - 5 second hold  ASSESSMENT:  CLINICAL IMPRESSION: Patient is a 58 y.o. male who was seen today for physical therapy evaluation and treatment for  Diagnosis  M25.562,G89.29 (ICD-10-CM) - Chronic pain of left knee  M25.552 (ICD-10-CM) - Pain of left hip  Z96.641 (ICD-10-CM) - History of total right hip replacement  E66.01 (ICD-10-CM) - Severe obesity (BMI >= 40) (HCC)  M70.61 (ICD-10-CM) - Trochanteric bursitis, right hip  .  Nakoa notes bilateral hip and left knee pain of over a year duration.  Things have progressed to the point that he made an appointment with his orthopedic specialist who referred him for physical therapy.  Pratyush has significant hip tightness bilaterally, weak hip abductors and weak quadriceps bilaterally.  Improving hip active range of  motion, flexibility quadriceps and hip abductor strength should allow Terryl to reduce his pain and function at a higher level than he is presently.  OBJECTIVE IMPAIRMENTS: Abnormal gait, decreased activity tolerance, decreased endurance, decreased knowledge of condition, decreased mobility, difficulty walking, decreased ROM, decreased strength, increased edema, impaired perceived functional ability, impaired flexibility, obesity, and pain.   ACTIVITY LIMITATIONS: carrying, lifting, bending, sitting, standing, squatting, sleeping, stairs, and locomotion level  PARTICIPATION LIMITATIONS: community activity and occupation  PERSONAL FACTORS: Rt THA in 2012, previous left knee scope, COPD, HLD, HTN, peripheral arterial disease are also affecting patient's functional outcome.   REHAB POTENTIAL: Good  CLINICAL DECISION MAKING: Stable/uncomplicated  EVALUATION COMPLEXITY: Low   GOALS: Goals reviewed with patient? Yes  SHORT TERM GOALS: Target date: 07/17/2023 Riggin will be independent with his day 1 home exercise program Baseline: Started 06/19/2023 Goal status: INITIAL  2.  Improve bilateral lower extremity flexibility for hip flexors to 90 degrees; hamstrings to 45 degrees and hip external rotation to at least 30 degrees Baseline: 75/75; 35/35 and 17/19 respectively Goal status: INITIAL   LONG TERM GOALS: Target date: 08/14/2023  Improve FOTO to 62 in 11 visits Baseline: 54 Goal status: INITIAL  2.  Ilias will report bilateral hip and left knee pain consistently 0-3/10 on the visual analog scale Baseline: 2-4/10 Goal status: INITIAL  3.  Improve bilateral hip external rotation AROM to at least 35 degrees Baseline: 17/19 degrees Goal status: INITIAL  4.  Improve bilateral hip abductors and quadricep strength to at least 5-/5 MMT Baseline: 4- to 4+ MMT Goal status: INITIAL  5.  Quantavis will report significant improvements in his ability to walk longer distances and ascend  stairs Baseline: Significantly limited at evaluation Goal status: INITIAL  6.  Travas will be independent with his long-term home exercise program at discharge Baseline: Started 06/19/2023 Goal status: INITIAL   PLAN:  PT FREQUENCY: 1-2x/week  PT DURATION: 8 weeks  PLANNED INTERVENTIONS: 97110-Therapeutic exercises, 97530- Therapeutic activity, 97112- Neuromuscular re-education, 97535- Self Care, [id number]- Manual therapy, [phone]- Gait training, Patient/Family education, Balance training, Stair training, Dry Needling, and Cryotherapy, Vasopneumatic  PLAN FOR NEXT SESSION: Review day 1 home exercises and make appropriate progressions to hip  active range of motion, quadriceps strength and hip abductor strength, particularly with his home exercise program while avoiding overuse and flaring him up   Myer LELON Ivory, PT, MPT 06/19/2023, 5:10 PM

## 2023-06-25 ENCOUNTER — Ambulatory Visit: Payer: 59 | Admitting: Rehabilitative and Restorative Service Providers"

## 2023-06-25 ENCOUNTER — Encounter: Payer: Self-pay | Admitting: Rehabilitative and Restorative Service Providers"

## 2023-06-25 DIAGNOSIS — R600 Localized edema: Secondary | ICD-10-CM

## 2023-06-25 DIAGNOSIS — R262 Difficulty in walking, not elsewhere classified: Secondary | ICD-10-CM

## 2023-06-25 DIAGNOSIS — M25552 Pain in left hip: Secondary | ICD-10-CM

## 2023-06-25 DIAGNOSIS — M6281 Muscle weakness (generalized): Secondary | ICD-10-CM

## 2023-06-25 DIAGNOSIS — M25551 Pain in right hip: Secondary | ICD-10-CM | POA: Diagnosis not present

## 2023-06-25 DIAGNOSIS — G8929 Other chronic pain: Secondary | ICD-10-CM

## 2023-06-25 DIAGNOSIS — M25562 Pain in left knee: Secondary | ICD-10-CM

## 2023-06-25 NOTE — Therapy (Signed)
 OUTPATIENT PHYSICAL THERAPY TREATMENT   Patient Name: JAYTON POPELKA MRN: [id number] DOB:[birth year], 58 y.o., male Today's Date: 06/25/2023  END OF SESSION:  PT End of Session - 06/25/23 1554     Visit Number 2    Number of Visits 11    Date for PT Re-Evaluation 08/14/23    Progress Note Due on Visit 11    PT Start Time 1554    PT Stop Time 1634    PT Time Calculation (min) 40 min    Activity Tolerance Patient tolerated treatment well    Behavior During Therapy [facility] for tasks assessed/performed              Past Medical History:  Diagnosis Date   Allergy    Avascular necrosis (HCC)    right hip   COPD (chronic obstructive pulmonary disease) (HCC)    Genital warts    Hyperlipidemia    Hypertension    Peripheral arterial disease (HCC)    Past Surgical History:  Procedure Laterality Date   ABDOMINAL AORTOGRAM W/LOWER EXTREMITY N/A 03/25/2022   Procedure: ABDOMINAL AORTOGRAM W/LOWER EXTREMITY;  Surgeon: Serene Gaile ORN, MD;  Location: [facility];  Service: Cardiovascular;  Laterality: N/A;   ARTHROSCOPY KNEE W/ DRILLING  1980's   left knee   HERNIA REPAIR  yrs ago   x 2   PERIPHERAL VASCULAR INTERVENTION Left 03/25/2022   Procedure: PERIPHERAL VASCULAR INTERVENTION;  Surgeon: Serene Gaile ORN, MD;  Location: [facility];  Service: Cardiovascular;  Laterality: Left;   TOTAL HIP ARTHROPLASTY  05/30/2011   Procedure: TOTAL HIP ARTHROPLASTY ANTERIOR APPROACH;  Surgeon: Lonni CINDERELLA Poli;  Location: [facility];  Service: Orthopedics;  Laterality: Right;  Right Total Hip Arthroplasty, Direct Anterior Approach (C-Arm)   Patient Active Problem List   Diagnosis Date Noted   Bilateral lower extremity edema 01/01/2022   Alopecia areata 08/04/2018   Hyperlipidemia 06/02/2018   Preventative health care 04/08/2017   COPD exacerbation (HCC) 10/05/2012   Snoring 10/05/2012   Tobacco use disorder 10/05/2012   Avascular necrosis of right femoral head (HCC)  05/30/2011    PCP: Comer MARLA Gaskins, NP  REFERRING PROVIDER: Lonni CINDERELLA Poli, MD  REFERRING DIAG:  Diagnosis  [phone] (ICD-10-CM) - Chronic pain of left knee  M25.552 (ICD-10-CM) - Pain of left hip  Z96.641 (ICD-10-CM) - History of total right hip replacement  E66.01 (ICD-10-CM) - Severe obesity (BMI >= 40) (HCC)  M70.61 (ICD-10-CM) - Trochanteric bursitis, right hip    THERAPY DIAG:  Difficulty in walking, not elsewhere classified  Localized edema  Muscle weakness (generalized)  Bilateral hip pain  Chronic pain of left knee  Rationale for Evaluation and Treatment: Rehabilitation  ONSET DATE: Over a year, saw Dr. Poli a few weeks ago due to gradual worsening  SUBJECTIVE:   SUBJECTIVE STATEMENT: Pt states that the pain has reduced since last visit. Has been doing HEP without issue. 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 Comes in today after work with no signs of distress and does not endorse high pain.   PERTINENT HISTORY: Rt THA in 2012, previous left knee scope, COPD, HLD, HTN, peripheral arterial disease  PAIN:  NPRS scale: < 2/10 at worst since last visit.  Pain location: Left knee and both hips Pain description: Achy, sore, constant Aggravating factors: Walking and ascending stairs Relieving factors: Cortisone shot, take it easy  PRECAUTIONS: None  RED FLAGS: None   WEIGHT BEARING RESTRICTIONS: No  FALLS:  Has patient fallen in last 6 months? No  LIVING ENVIRONMENT: Lives with: lives with their family and lives with their spouse Lives in: House/apartment Stairs:  Difficulty ascending Has following equipment at home: None  OCCUPATION: Heavy theatre stage manager  PLOF: Independent  PATIENT GOALS: Decrease pain, improve endurance and comfort with ascending stairs and prolonged walking  NEXT MD VISIT: As needed  OBJECTIVE:  Note: Objective measures were completed at Evaluation unless otherwise  noted.  DIAGNOSTIC FINDINGS: An AP pelvis and lateral both hips shows a well-seated right total hip  arthroplasty with no complicating features.  The left hip joint space is  well-maintained.  2 views of the left knee show tricompartment arthritis with medial and  lateral joint space narrowing as well as patellofemoral narrowing.  There  are osteophytes in all 3 compartments.  PATIENT SURVEYS:  06/19/2023 FOTO 54 (Goal 62 in 11 visits)  COGNITION: 06/19/2023 Overall cognitive status: Within functional limits for tasks assessed     SENSATION: 06/19/2023 No complaints of peripheral pain or paresthesias  EDEMA:  06/19/2023 Edema is noted of the left knee although no objective measures were taken  MUSCLE LENGTH: 06/19/2023 Hamstrings: Right 35 deg; Left 35 deg  LOWER EXTREMITY ROM:  Passive ROM Left/Right 06/19/2023   Hip flexion 75/75   Hip extension    Hip abduction    Hip adduction    Hip internal rotation 13/14   Hip external rotation 17/19   Knee flexion 129/132   Knee extension 0/0   Ankle dorsiflexion    Ankle plantarflexion    Ankle inversion    Ankle eversion     (Blank rows = not tested)  LOWER EXTREMITY MMT:  MMT out of 5 Left/Right 06/19/2023   Hip flexion    Hip extension    Hip abduction 4/4-   Hip adduction    Hip internal rotation    Hip external rotation    Knee flexion    Knee extension 4+/4   Ankle dorsiflexion    Ankle plantarflexion    Ankle inversion    Ankle eversion     (Blank rows = not tested)  GAIT: 06/19/2023 Distance walked: 100 feet Assistive device utilized: None Level of assistance: Complete Independence Comments: Mathis walks with a very stiff gait, particularly with the first few steps after standing.  He notes particular difficulty with prolonged walking and ascending stairs.  TREATMENT DATE:  06/25/2023 Nustep UE/LE  Knee to chest 2x30ec Supine LB rotation stretch 2x30sec Bridge 2x10 with 5sec hold vc for proper form and  reduction of cramping Supine bent knee fallout with BTB  2x15ea STS at 23in high with controlled eccentric x20 Seated SLR with quad set x15; vc for controlled lowering for prevention of extensor lag    06/19/2023    Supine lower trunk rotation 10 x 10 seconds Supine figure 4 stretch 2 x 20 seconds (hold next visit secondary to discomfort) Single knee-to-chest stretch 4 x 20 seconds Supine quadriceps sets 2 sets of 10 for 5 seconds                                                                 Functional Activities:  Reviewed exam findings, day 1 home exercise program, discussed anatomy of the knee  and hip and the importance of strengthening hip abductors and quadriceps musculature to reduce pain and allow candle to return to his previous level of function with less discomfort  PATIENT EDUCATION:  06/19/2023 Education details: See above Person educated: Patient Education method: Explanation, Demonstration, Tactile cues, Verbal cues, and Handouts Education comprehension: verbalized understanding, returned demonstration, verbal cues required, tactile cues required, and needs further education  HOME EXERCISE PROGRAM: Access Code: [facility] URL: [email or web address] Date: 06/19/2023 Prepared by: Lamar Ivory  Exercises - Single Knee to Chest Stretch  - 2-3 x daily - 7 x weekly - 1 sets - 5 reps - 20 seconds hold - Supine Lower Trunk Rotation  - 2-3 x daily - 7 x weekly - 1 sets - 10 reps - 10 seconds hold - Supine Quadriceps Sets  - 3-5 x daily - 7 x weekly - 2-3 sets - 10 reps - 5 second hold  ASSESSMENT:  CLINICAL IMPRESSION: Patient did well with exercises during session, tolerating additional exercises with increased band resistance. Patient required VC for proper form and cadence during activity. To target patients knee pain during activities requiring eccentric control, addition of STS from elevated surface were performed requiring visual and verbal cues. Patient  educated on proper form for reduction of knee pain. Patient demonstrated extensor lag with fatigue with seated SLR with quad set and will benefit from activity targeting quad strengthening. Patient continues to demonstrate deficits in strength and endurance and will benefit from continued skilled physical therapy.  OBJECTIVE IMPAIRMENTS: Abnormal gait, decreased activity tolerance, decreased endurance, decreased knowledge of condition, decreased mobility, difficulty walking, decreased ROM, decreased strength, increased edema, impaired perceived functional ability, impaired flexibility, obesity, and pain.   ACTIVITY LIMITATIONS: carrying, lifting, bending, sitting, standing, squatting, sleeping, stairs, and locomotion level  PARTICIPATION LIMITATIONS: community activity and occupation  PERSONAL FACTORS: Rt THA in 2012, previous left knee scope, COPD, HLD, HTN, peripheral arterial disease are also affecting patient's functional outcome.   REHAB POTENTIAL: Good  CLINICAL DECISION MAKING: Stable/uncomplicated  EVALUATION COMPLEXITY: Low   GOALS: Goals reviewed with patient? Yes  SHORT TERM GOALS: Target date: 07/17/2023 Earmon will be independent with his day 1 home exercise program Baseline: Started 06/19/2023 Goal status: on going 06/25/2023  2.  Improve bilateral lower extremity flexibility for hip flexors to 90 degrees; hamstrings to 45 degrees and hip external rotation to at least 30 degrees Baseline: 75/75; 35/35 and 17/19 respectively Goal status:  on going 06/25/2023   LONG TERM GOALS: Target date: 08/14/2023  Improve FOTO to 62 in 11 visits Baseline: 54 Goal status: INITIAL  2.  Leshawn will report bilateral hip and left knee pain consistently 0-3/10 on the visual analog scale Baseline: 2-4/10 Goal status: INITIAL  3.  Improve bilateral hip external rotation AROM to at least 35 degrees Baseline: 17/19 degrees Goal status: INITIAL  4.  Improve bilateral hip abductors and  quadricep strength to at least 5-/5 MMT Baseline: 4- to 4+ MMT Goal status: INITIAL  5.  Vernal will report significant improvements in his ability to walk longer distances and ascend stairs Baseline: Significantly limited at evaluation Goal status: INITIAL  6.  Kaylub will be independent with his long-term home exercise program at discharge Baseline: Started 06/19/2023 Goal status: INITIAL   PLAN:  PT FREQUENCY: 1-2x/week  PT DURATION: 8 weeks  PLANNED INTERVENTIONS: 97110-Therapeutic exercises, 97530- Therapeutic activity, 97112- Neuromuscular re-education, 97535- Self Care, [id number]- Manual therapy, [phone]- Gait training, Patient/Family education, Balance training, Stair training, Dry Needling, and Cryotherapy, Vasopneumatic  PLAN FOR NEXT SESSION: Continue quad strengthening activities in closed chain (shuttle press) Supine stretching for LB pain modulation and stiffness control  Ozell Silvan, PT, DPT, OCS, ATC 06/25/23  4:40 PM

## 2023-07-03 ENCOUNTER — Ambulatory Visit: Payer: 59 | Admitting: Rehabilitative and Restorative Service Providers"

## 2023-07-03 ENCOUNTER — Encounter: Payer: Self-pay | Admitting: Rehabilitative and Restorative Service Providers"

## 2023-07-03 DIAGNOSIS — R600 Localized edema: Secondary | ICD-10-CM

## 2023-07-03 DIAGNOSIS — M6281 Muscle weakness (generalized): Secondary | ICD-10-CM

## 2023-07-03 DIAGNOSIS — G8929 Other chronic pain: Secondary | ICD-10-CM

## 2023-07-03 DIAGNOSIS — R262 Difficulty in walking, not elsewhere classified: Secondary | ICD-10-CM

## 2023-07-03 DIAGNOSIS — M25551 Pain in right hip: Secondary | ICD-10-CM | POA: Diagnosis not present

## 2023-07-03 DIAGNOSIS — M25562 Pain in left knee: Secondary | ICD-10-CM | POA: Diagnosis not present

## 2023-07-03 DIAGNOSIS — M25552 Pain in left hip: Secondary | ICD-10-CM

## 2023-07-03 NOTE — Therapy (Addendum)
OUTPATIENT PHYSICAL THERAPY TREATMENT   Patient Name: Jerome Thomas MRN: [id number] DOB:1966/03/22, 58 y.o., male Today's Date: 07/03/2023  END OF SESSION:  PT End of Session - 07/03/23 0808     Visit Number 3    Number of Visits 11    Date for PT Re-Evaluation 08/14/23    Progress Note Due on Visit 11    PT Start Time 0804    PT Stop Time 0843    PT Time Calculation (min) 39 min    Activity Tolerance Patient tolerated treatment well;Patient limited by pain    Behavior During Therapy [facility] for tasks assessed/performed               Past Medical History:  Diagnosis Date   Allergy    Avascular necrosis (HCC)    right hip   COPD (chronic obstructive pulmonary disease) (HCC)    Genital warts    Hyperlipidemia    Hypertension    Peripheral arterial disease (HCC)    Past Surgical History:  Procedure Laterality Date   ABDOMINAL AORTOGRAM W/LOWER EXTREMITY N/A 03/25/2022   Procedure: ABDOMINAL AORTOGRAM W/LOWER EXTREMITY;  Surgeon: Jerome Libman, MD;  Location: [facility];  Service: Cardiovascular;  Laterality: N/A;   ARTHROSCOPY KNEE W/ DRILLING  1980's   left knee   HERNIA REPAIR  yrs ago   x 2   PERIPHERAL VASCULAR INTERVENTION Left 03/25/2022   Procedure: PERIPHERAL VASCULAR INTERVENTION;  Surgeon: Jerome Libman, MD;  Location: [facility];  Service: Cardiovascular;  Laterality: Left;   TOTAL HIP ARTHROPLASTY  05/30/2011   Procedure: TOTAL HIP ARTHROPLASTY ANTERIOR APPROACH;  Surgeon: Jerome Thomas;  Location: [facility];  Service: Orthopedics;  Laterality: Right;  Right Total Hip Arthroplasty, Direct Anterior Approach (C-Arm)   Patient Active Problem List   Diagnosis Date Noted   Bilateral lower extremity edema 01/01/2022   Alopecia areata 08/04/2018   Hyperlipidemia 06/02/2018   Preventative health care 04/08/2017   COPD exacerbation (HCC) 10/05/2012   Snoring 10/05/2012   Tobacco use disorder 10/05/2012   Avascular necrosis of  right femoral head (HCC) 05/30/2011    PCP: Jerome Nest, NP  REFERRING PROVIDER: Kathryne Hitch, MD  REFERRING DIAG:  Diagnosis  [id number] (ICD-10-CM) - Chronic pain of left knee  M25.552 (ICD-10-CM) - Pain of left hip  Z96.641 (ICD-10-CM) - History of total right hip replacement  E66.01 (ICD-10-CM) - Severe obesity (BMI >= 40) (HCC)  M70.61 (ICD-10-CM) - Trochanteric bursitis, right hip    THERAPY DIAG:  Difficulty in walking, not elsewhere classified  Muscle weakness (generalized)  Chronic pain of left knee  Bilateral hip pain  Localized edema  Rationale for Evaluation and Treatment: Rehabilitation  ONSET DATE: Over a year, saw Dr. Magnus Thomas a few weeks ago due to gradual worsening  SUBJECTIVE:   SUBJECTIVE STATEMENT: Patient arrives to session with stiff rise from chair and stiff gait. Endorses more stiffness this week than last week both in bilateral hips and low back. Has not taken pain meds or tried heat for symptom relief. States it feels like the steroid injections have worn off.   PERTINENT HISTORY: Rt THA in 2012, previous left knee scope, COPD, HLD, HTN, peripheral arterial disease  PAIN:  NPRS scale: increased pain and stiffness; no number reported  Pain location: Left knee and both hips Pain description: Achy, sore, constant Aggravating factors: Walking and ascending stairs Relieving factors: Cortisone shot, take it easy  PRECAUTIONS: None  RED FLAGS:  None   WEIGHT BEARING RESTRICTIONS: No  FALLS:  Has patient fallen in last 6 months? No  LIVING ENVIRONMENT: Lives with: lives with their family and lives with their spouse Lives in: House/apartment Stairs:  Difficulty ascending Has following equipment at home: None  OCCUPATION: Heavy Theatre stage manager  PLOF: Independent  PATIENT GOALS: Decrease pain, improve endurance and comfort with ascending stairs and prolonged walking  NEXT MD VISIT: As needed  OBJECTIVE:  Note:  Objective measures were completed at Evaluation unless otherwise noted.  DIAGNOSTIC FINDINGS: An AP pelvis and lateral both hips shows a well-seated right total hip  arthroplasty with no complicating features.  The left hip joint space is  well-maintained.  2 views of the left knee show tricompartment arthritis with medial and  lateral joint space narrowing as well as patellofemoral narrowing.  There  are osteophytes in all 3 compartments.  PATIENT SURVEYS:  06/19/2023 FOTO 54 (Goal 62 in 11 visits)  COGNITION: 06/19/2023 Overall cognitive status: Within functional limits for tasks assessed     SENSATION: 06/19/2023 No complaints of peripheral pain or paresthesias  EDEMA:  06/19/2023 Edema is noted of the left knee although no objective measures were taken  MUSCLE LENGTH: 06/19/2023 Hamstrings: Right 35 deg; Left 35 deg  LOWER EXTREMITY ROM:  Passive ROM Left/Right 06/19/2023   Hip flexion 75/75   Hip extension    Hip abduction    Hip adduction    Hip internal rotation 13/14   Hip external rotation 17/19   Knee flexion 129/132   Knee extension 0/0   Ankle dorsiflexion    Ankle plantarflexion    Ankle inversion    Ankle eversion     (Blank rows = not tested)  LOWER EXTREMITY MMT:  MMT out of 5 Left/Right 06/19/2023   Hip flexion    Hip extension    Hip abduction 4/4-   Hip adduction    Hip internal rotation    Hip external rotation    Knee flexion    Knee extension 4+/4   Ankle dorsiflexion    Ankle plantarflexion    Ankle inversion    Ankle eversion     (Blank rows = not tested)  Lumbar ROM:  07/03/2023 Passive extension 75% normal range of motion GAIT: 06/19/2023 Distance walked: 100 feet Assistive device utilized: None Level of assistance: Complete Independence Comments: Jerome Thomas walks with a very stiff gait, particularly with the first few steps after standing.  He notes particular difficulty with prolonged walking and ascending stairs.  TREATMENT DATE:   07/03/2023 Therex: Nustep UE/LE   Heat applied to lower lumbar during supine stretching to address stiffness and facilitate myofacial release Knee to chest 2x30ec ea with towel  Supine LB rotation stretch 2x30sec  Additional time spent in review of existin HEP and techniques with question and answer time provided to improve knowledge and home use for symptom management  Manual: Percussion device soft tissue mobilization to QL, R lumbar paraspinals, R glute med and max to tolerance    06/25/2023 Nustep UE/LE  Knee to chest 2x30ec Supine LB rotation stretch 2x30sec Bridge 2x10 with 5sec hold vc for proper form and reduction of cramping Supine bent knee fallout with BTB  2x15ea STS at 23in high with controlled eccentric x20 Seated SLR with quad set x15; vc for controlled lowering for prevention of extensor lag    06/19/2023    Supine lower trunk rotation 10 x 10 seconds Supine figure 4 stretch 2 x 20 seconds (hold  next visit secondary to discomfort) Single knee-to-chest stretch 4 x 20 seconds Supine quadriceps sets 2 sets of 10 for 5 seconds                                                                 Functional Activities:  Reviewed exam findings, day 1 home exercise program, discussed anatomy of the knee and hip and the importance of strengthening hip abductors and quadriceps musculature to reduce pain and allow candle to return to his previous level of function with less discomfort  PATIENT EDUCATION:  07/03/2023 Patient educated on options for muscle releases via theragun/dry needling/tennis ball rolling 06/19/2023 Education details: See above Person educated: Patient Education method: Explanation, Demonstration, Tactile cues, Verbal cues, and Handouts Education comprehension: verbalized understanding, returned demonstration, verbal cues required, tactile cues required, and needs further education  HOME EXERCISE PROGRAM: Access Code: [facility] URL:  [email or web address] Date: 06/19/2023 Prepared by: Pauletta Browns  Exercises - Single Knee to Chest Stretch  - 2-3 x daily - 7 x weekly - 1 sets - 5 reps - 20 seconds hold - Supine Lower Trunk Rotation  - 2-3 x daily - 7 x weekly - 1 sets - 10 reps - 10 seconds hold - Supine Quadriceps Sets  - 3-5 x daily - 7 x weekly - 2-3 sets - 10 reps - 5 second hold  ASSESSMENT:  CLINICAL IMPRESSION: Patient required more hands on treatment and stretching due to increased pain and stiffness. After application of heat and percussive device patient was able to improve standing lumbar extension to about 75% of normal range of motion. Patient continues to demonstrate deficits in mobility, strength, and endurance and will benefit from continued skilled physical therapy.  OBJECTIVE IMPAIRMENTS: Abnormal gait, decreased activity tolerance, decreased endurance, decreased knowledge of condition, decreased mobility, difficulty walking, decreased ROM, decreased strength, increased edema, impaired perceived functional ability, impaired flexibility, obesity, and pain.   ACTIVITY LIMITATIONS: carrying, lifting, bending, sitting, standing, squatting, sleeping, stairs, and locomotion level  PARTICIPATION LIMITATIONS: community activity and occupation  PERSONAL FACTORS: Rt THA in 2012, previous left knee scope, COPD, HLD, HTN, peripheral arterial disease are also affecting patient's functional outcome.   REHAB POTENTIAL: Good  CLINICAL DECISION MAKING: Stable/uncomplicated  EVALUATION COMPLEXITY: Low   GOALS: Goals reviewed with patient? Yes  SHORT TERM GOALS: Target date: 07/17/2023 Seamon will be independent with his day 1 home exercise program Baseline: Started 06/19/2023 Goal status: on going 06/25/2023  2.  Improve bilateral lower extremity flexibility for hip flexors to 90 degrees; hamstrings to 45 degrees and hip external rotation to at least 30 degrees Baseline: 75/75; 35/35 and 17/19  respectively Goal status:  on going 06/25/2023   LONG TERM GOALS: Target date: 08/14/2023  Improve FOTO to 62 in 11 visits Baseline: 54 Goal status: INITIAL  2.  Brandel will report bilateral hip and left knee pain consistently 0-3/10 on the visual analog scale Baseline: 2-4/10 Goal status: INITIAL  3.  Improve bilateral hip external rotation AROM to at least 35 degrees Baseline: 17/19 degrees Goal status: INITIAL  4.  Improve bilateral hip abductors and quadricep strength to at least 5-/5 MMT Baseline: 4- to 4+ MMT Goal status: INITIAL  5.  Milik will report significant improvements in his ability to walk longer  distances and ascend stairs Baseline: Significantly limited at evaluation Goal status: INITIAL  6.  Alixander will be independent with his long-term home exercise program at discharge Baseline: Started 06/19/2023 Goal status: INITIAL   PLAN:  PT FREQUENCY: 1-2x/week  PT DURATION: 8 weeks  PLANNED INTERVENTIONS: 97110-Therapeutic exercises, 97530- Therapeutic activity, 97112- Neuromuscular re-education, 97535- Self Care, [id number]- Manual therapy, [phone]- Gait training, Patient/Family education, Balance training, Stair training, Dry Needling, and Cryotherapy, Vasopneumatic  PLAN FOR NEXT SESSION: Continue lumbar stretching and use of percussive device. Trial dry needling if patient agreeable.   Modest Draeger, Keldon Lassen, Student-PT 07/03/2023, 9:15 AM

## 2023-07-08 ENCOUNTER — Other Ambulatory Visit: Payer: Self-pay

## 2023-07-08 ENCOUNTER — Ambulatory Visit: Payer: 59 | Admitting: Rehabilitative and Restorative Service Providers"

## 2023-07-08 ENCOUNTER — Encounter: Payer: Self-pay | Admitting: Rehabilitative and Restorative Service Providers"

## 2023-07-08 ENCOUNTER — Encounter: Payer: Self-pay | Admitting: Orthopaedic Surgery

## 2023-07-08 ENCOUNTER — Ambulatory Visit: Payer: 59 | Admitting: Orthopaedic Surgery

## 2023-07-08 DIAGNOSIS — M25551 Pain in right hip: Secondary | ICD-10-CM

## 2023-07-08 DIAGNOSIS — M25552 Pain in left hip: Secondary | ICD-10-CM | POA: Diagnosis not present

## 2023-07-08 DIAGNOSIS — M25562 Pain in left knee: Secondary | ICD-10-CM | POA: Diagnosis not present

## 2023-07-08 DIAGNOSIS — M6281 Muscle weakness (generalized): Secondary | ICD-10-CM | POA: Diagnosis not present

## 2023-07-08 DIAGNOSIS — G8929 Other chronic pain: Secondary | ICD-10-CM | POA: Diagnosis not present

## 2023-07-08 DIAGNOSIS — R600 Localized edema: Secondary | ICD-10-CM

## 2023-07-08 DIAGNOSIS — R262 Difficulty in walking, not elsewhere classified: Secondary | ICD-10-CM | POA: Diagnosis not present

## 2023-07-08 NOTE — Therapy (Signed)
OUTPATIENT PHYSICAL THERAPY TREATMENT   Patient Name: Jerome Thomas MRN: [id number] DOB:April 07, 1966, 58 y.o., male Today's Date: 07/08/2023  END OF SESSION:  PT End of Session - 07/08/23 1705     Visit Number 4    Number of Visits 11    Date for PT Re-Evaluation 08/14/23    Progress Note Due on Visit 11    PT Start Time 1515    PT Stop Time 1609    PT Time Calculation (min) 54 min    Activity Tolerance Patient tolerated treatment well;No increased pain    Behavior During Therapy WFL for tasks assessed/performed             Past Medical History:  Diagnosis Date   Allergy    Avascular necrosis (HCC)    right hip   COPD (chronic obstructive pulmonary disease) (HCC)    Genital warts    Hyperlipidemia    Hypertension    Peripheral arterial disease (HCC)    Past Surgical History:  Procedure Laterality Date   ABDOMINAL AORTOGRAM W/LOWER EXTREMITY N/A 03/25/2022   Procedure: ABDOMINAL AORTOGRAM W/LOWER EXTREMITY;  Surgeon: Jerome Libman, MD;  Location: [facility];  Service: Cardiovascular;  Laterality: N/A;   ARTHROSCOPY KNEE W/ DRILLING  1980's   left knee   HERNIA REPAIR  yrs ago   x 2   PERIPHERAL VASCULAR INTERVENTION Left 03/25/2022   Procedure: PERIPHERAL VASCULAR INTERVENTION;  Surgeon: Jerome Libman, MD;  Location: [facility];  Service: Cardiovascular;  Laterality: Left;   TOTAL HIP ARTHROPLASTY  05/30/2011   Procedure: TOTAL HIP ARTHROPLASTY ANTERIOR APPROACH;  Surgeon: Jerome Thomas;  Location: [facility];  Service: Orthopedics;  Laterality: Right;  Right Total Hip Arthroplasty, Direct Anterior Approach (C-Arm)   Patient Active Problem List   Diagnosis Date Noted   Bilateral lower extremity edema 01/01/2022   Alopecia areata 08/04/2018   Hyperlipidemia 06/02/2018   Preventative health care 04/08/2017   COPD exacerbation (HCC) 10/05/2012   Snoring 10/05/2012   Tobacco use disorder 10/05/2012   Avascular necrosis of right femoral  head (HCC) 05/30/2011    PCP: Jerome Nest, NP  REFERRING PROVIDER: Kathryne Hitch, MD  REFERRING DIAG:  Diagnosis  3316440569 (ICD-10-CM) - Chronic pain of left knee  M25.552 (ICD-10-CM) - Pain of left hip  Z96.641 (ICD-10-CM) - History of total right hip replacement  E66.01 (ICD-10-CM) - Severe obesity (BMI >= 40) (HCC)  M70.61 (ICD-10-CM) - Trochanteric bursitis, right hip    THERAPY DIAG:  Difficulty in walking, not elsewhere classified  Muscle weakness (generalized)  Chronic pain of left knee  Bilateral hip pain  Localized edema  Rationale for Evaluation and Treatment: Rehabilitation  ONSET DATE: Over a year, saw Dr. Magnus Thomas a few weeks ago due to gradual worsening  SUBJECTIVE:   SUBJECTIVE STATEMENT: 1 time a day HEP compliance.  An notes feeling better than when he began physical therapy, although he is not where he would like to be yet.   PERTINENT HISTORY: Rt THA in 2012, previous left knee scope, COPD, HLD, HTN, peripheral arterial disease  PAIN:  NPRS scale: 0-7/10 Pain location: Left knee and both hips Pain description: Achy, sore, less constant Aggravating factors: Transition from prolonged sitting to standing, Walking and ascending stairs Relieving factors: Cortisone shot, take it easy  PRECAUTIONS: None  RED FLAGS: None   WEIGHT BEARING RESTRICTIONS: No  FALLS:  Has patient fallen in last 6 months? 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  DIAGNOSTIC FINDINGS: An AP pelvis and lateral both hips  shows a well-seated right total hip  arthroplasty with no complicating features.  The left hip joint space is  well-maintained.  2 views of the left knee show tricompartment arthritis with medial and  lateral joint space narrowing as well as patellofemoral narrowing.  There  are osteophytes in all 3 compartments.  PATIENT SURVEYS:  06/19/2023 FOTO 54 (Goal 62 in 11 visits)  COGNITION: 06/19/2023 Overall cognitive status: Within functional limits for tasks assessed     SENSATION: 06/19/2023 No complaints of peripheral pain or paresthesias  EDEMA:  06/19/2023 Edema is noted of the left knee although no objective measures were taken  MUSCLE LENGTH: 07/08/2023 Hamstrings: Right 35 deg; Left 40 deg  06/19/2023 Hamstrings: Right 35 deg; Left 35 deg  LOWER EXTREMITY ROM:  Passive ROM Left/Right 06/19/2023 Left/Right 07/08/2023  Hip flexion 75/75 80/80  Hip extension    Hip abduction    Hip adduction    Hip internal rotation 13/14 17/12  Hip external rotation 17/19 27/16  Knee flexion 129/132   Knee extension 0/0   Ankle dorsiflexion    Ankle plantarflexion    Ankle inversion    Ankle eversion     (Blank rows = not tested)  LOWER EXTREMITY MMT:  MMT out of 5 Left/Right 06/19/2023   Hip flexion    Hip extension    Hip abduction 4/4-   Hip adduction    Hip internal rotation    Hip external rotation    Knee flexion    Knee extension 4+/4   Ankle dorsiflexion    Ankle plantarflexion    Ankle inversion    Ankle eversion     (Blank rows = not tested)  Lumbar ROM:  07/03/2023 Passive extension 75% normal range of motion GAIT: 06/19/2023 Distance walked: 100 feet Assistive device utilized: None Level of assistance: Complete Independence Comments: Jerome Thomas walks with a very stiff gait, particularly with the first few steps after standing.  He notes particular difficulty with prolonged walking and ascending stairs.  TREATMENT DATE:  07/08/2023 Nu Step Level 7 for 8 minutes lower  extremity only Seated straight leg raises 3 sets of 5 for 2 seconds Bridging 10 x 5 seconds Supine figure 4 stretch 4 x 20 seconds (much better than at evaluation) Single knee-to-chest stretch 4 x 20 seconds with strap Standing hip hike 2 sets of 10 for 3 seconds Side-lying clams with resistance red Thera-Band 10 x 5 seconds bilaterally  Completed and objective reassessment of hip active range of motion, reviewed anatomy of the hip and knee and of the importance of strengthening quadriceps and hip abductors musculature specifically   07/03/2023 Therex: Nustep UE/LE   Heat applied to lower lumbar during supine stretching to address stiffness and facilitate myofacial release Knee to chest 2x30ec ea with towel  Supine LB rotation stretch 2x30sec  Additional time spent in review of existin HEP and techniques with question and answer time provided to improve knowledge and home use for symptom management  Manual: Percussion device soft tissue mobilization to QL, R lumbar paraspinals, R glute med  and max to tolerance     06/25/2023 Nustep UE/LE  Knee to chest 2x30ec Supine LB rotation stretch 2x30sec Bridge 2x10 with 5sec hold vc for proper form and reduction of cramping Supine bent knee fallout with BTB  2x15ea STS at 23in high with controlled eccentric x20 Seated SLR with quad set x15; vc for controlled lowering for prevention of extensor lag    PATIENT EDUCATION:  07/03/2023 Patient educated on options for muscle releases via theragun/dry needling/tennis ball rolling 06/19/2023 Education details: See above Person educated: Patient Education method: Explanation, Demonstration, Tactile cues, Verbal cues, and Handouts Education comprehension: verbalized understanding, returned demonstration, verbal cues required, tactile cues required, and needs further education  HOME EXERCISE PROGRAM: Access Code: [facility] URL: [email or web address] Date:  07/08/2023 Prepared by: Pauletta Browns  Exercises - Single Knee to Chest Stretch  - 2-3 x daily - 7 x weekly - 1 sets - 5 reps - 20 seconds hold - Supine Lower Trunk Rotation  - 2-3 x daily - 1-7 x weekly - 1 sets - 10 reps - 10 seconds hold - Supine Quadriceps Sets  - 3-5 x daily - 1-7 x weekly - 2-3 sets - 10 reps - 5 second hold - Supine Figure 4 Piriformis Stretch  - 2-3 x daily - 7 x weekly - 1 sets - 5 reps - 20 seconds hold - Yoga Bridge  - 1 x daily - 7 x weekly - 2 sets - 10 reps - 5 seconds hold - Small Range Straight Leg Raise  - 2 x daily - 7 x weekly - 3-5 sets - 5 reps - 3 seconds hold - Standing Hip Hiking  - 1 x daily - 7 x weekly - 2 sets - 5 reps - 3 seconds hold  ASSESSMENT:  CLINICAL IMPRESSION: Carla notes progress with his pain and stiffness since starting physical therapy.  He is still not where he would like to be and although objective hip active range of motion progress is noted, he is still stiffer and weaker than what is expected in another month.  Quadriceps and hip abductor strengthening is particularly important with Penni Bombard and is a focus of his current home exercise program.  Treasure was encouraged to try to get his home exercises and more than 1 time a day and we worked on activities he might be able to get in sporadically during his workday to assist with compliance and functional progress.  Continue his current plan of care in order to meet long-term goals in another month.  OBJECTIVE IMPAIRMENTS: Abnormal gait, decreased activity tolerance, decreased endurance, decreased knowledge of condition, decreased mobility, difficulty walking, decreased ROM, decreased strength, increased edema, impaired perceived functional ability, impaired flexibility, obesity, and pain.   ACTIVITY LIMITATIONS: carrying, lifting, bending, sitting, standing, squatting, sleeping, stairs, and locomotion level  PARTICIPATION LIMITATIONS: community activity and occupation  PERSONAL  FACTORS: Rt THA in 2012, previous left knee scope, COPD, HLD, HTN, peripheral arterial disease are also affecting patient's functional outcome.   REHAB POTENTIAL: Good  CLINICAL DECISION MAKING: Stable/uncomplicated  EVALUATION COMPLEXITY: Low   GOALS: Goals reviewed with patient? Yes  SHORT TERM GOALS: Target date: 07/17/2023 Calder will be independent with his day 1 home exercise program Baseline: Started 06/19/2023 Goal status: Met 07/08/2023  2.  Improve bilateral lower extremity flexibility for hip flexors to 90 degrees; hamstrings to 45 degrees and hip external rotation to at least 30 degrees Baseline: 75/75; 35/35 and 17/19 respectively Goal status:  On Going 07/08/2023  LONG TERM GOALS: Target date: 08/14/2023  Improve FOTO to 62 in 11 visits Baseline: 54 Goal status: INITIAL  2.  Xaviar will report bilateral hip and left knee pain consistently 0-3/10 on the visual analog scale Baseline: 2-4/10 Goal status: On Going 07/08/2023  3.  Improve bilateral hip external rotation AROM to at least 35 degrees Baseline: 17/19 degrees Goal status: On Going 07/08/2023  4.  Improve bilateral hip abductors and quadricep strength to at least 5-/5 MMT Baseline: 4- to 4+ MMT Goal status: INITIAL  5.  [place] will report significant improvements in his ability to walk longer distances and ascend stairs Baseline: Significantly limited at evaluation Goal status: INITIAL  6.  Elisaul will be independent with his long-term home exercise program at discharge Baseline: Started 06/19/2023 Goal status: INITIAL   PLAN:  PT FREQUENCY: 1-2x/week  PT DURATION: 8 weeks  PLANNED INTERVENTIONS: 97110-Therapeutic exercises, 97530- Therapeutic activity, 97112- Neuromuscular re-education, 97535- Self Care, 16109- Manual therapy, [id number]- Gait training, Patient/Family education, Balance training, Stair training, Dry Needling, and Cryotherapy, Vasopneumatic  PLAN FOR NEXT SESSION: Continue emphasis  on quadriceps and hip abductor strengthening.  Update FOTO.  Consider progressions into tandem balance for HEP (functional strengthening) and stairs work in the office.  Cherlyn Cushing, PT, MPT 07/08/2023, 5:12 PM

## 2023-07-08 NOTE — Progress Notes (Signed)
The patient comes in for follow-up after having physical therapy.  We have replaced his right hip back in 2012.  He is someone who is morbidly obese and his BMI today is 42.82.  He says his left knee is still little sore and does pop on quite a bit.  X-rays have shown patella from arthritic changes and some joint space narrowing.  His left hip does hurt in the groin.  X-rays of the left hip and pelvis show well-seated total hip arthroplasty on the right side with joint space narrowing on the left side.  He cannot take anti-inflammatories since he is on Plavix.  He does understand that his weight can contribute to worsening joint pain and arthritis and we are still advocating weight loss.  His left knee does appear stable but there is patellofemoral arthritic changes.  The right hip moves smoothly.  The left hip moves smoothly but there is pain in the groin.  I would like to send him to Dr. Alvester Morin for an intra-articular steroid injection under fluoroscopy in the left hip joint.  We will then see him back in about 6 weeks to see how he is doing overall.  Will have a new weight and BMI calculation at that visit as well.  The patient agrees with this treatment plan.

## 2023-07-15 ENCOUNTER — Ambulatory Visit: Payer: 59 | Admitting: Rehabilitative and Restorative Service Providers"

## 2023-07-15 ENCOUNTER — Encounter: Payer: Self-pay | Admitting: Rehabilitative and Restorative Service Providers"

## 2023-07-15 DIAGNOSIS — M6281 Muscle weakness (generalized): Secondary | ICD-10-CM

## 2023-07-15 DIAGNOSIS — M25562 Pain in left knee: Secondary | ICD-10-CM | POA: Diagnosis not present

## 2023-07-15 DIAGNOSIS — R262 Difficulty in walking, not elsewhere classified: Secondary | ICD-10-CM | POA: Diagnosis not present

## 2023-07-15 DIAGNOSIS — G8929 Other chronic pain: Secondary | ICD-10-CM

## 2023-07-15 DIAGNOSIS — R600 Localized edema: Secondary | ICD-10-CM

## 2023-07-15 DIAGNOSIS — M25551 Pain in right hip: Secondary | ICD-10-CM

## 2023-07-15 DIAGNOSIS — M25552 Pain in left hip: Secondary | ICD-10-CM

## 2023-07-15 NOTE — Therapy (Addendum)
 OUTPATIENT PHYSICAL THERAPY TREATMENT / PROGRESS NOTE / DISCHARGE   Patient Name: Jerome Thomas MRN: [id number] DOB:1966/04/03, 58 y.o., male Today's Date: 07/15/2023  Progress Note Reporting Period 06/19/2023 to 07/15/2023  See note below for Objective Data and Assessment of Progress/Goals.    END OF SESSION:  PT End of Session - 07/15/23 1605     Visit Number 5    Number of Visits 11    Date for PT Re-Evaluation 08/14/23    Authorization Type [employer] $50 copay    Progress Note Due on Visit 15    PT Start Time 1559    PT Stop Time 1638    PT Time Calculation (min) 39 min    Activity Tolerance Patient tolerated treatment well    Behavior During Therapy WFL for tasks assessed/performed              Past Medical History:  Diagnosis Date   Allergy    Avascular necrosis (HCC)    right hip   COPD (chronic obstructive pulmonary disease) (HCC)    Genital warts    Hyperlipidemia    Hypertension    Peripheral arterial disease (HCC)    Past Surgical History:  Procedure Laterality Date   ABDOMINAL AORTOGRAM W/LOWER EXTREMITY N/A 03/25/2022   Procedure: ABDOMINAL AORTOGRAM W/LOWER EXTREMITY;  Surgeon: Margherita Shell, MD;  Location: [facility];  Service: Cardiovascular;  Laterality: N/A;   ARTHROSCOPY KNEE W/ DRILLING  1980's   left knee   HERNIA REPAIR  yrs ago   x 2   PERIPHERAL VASCULAR INTERVENTION Left 03/25/2022   Procedure: PERIPHERAL VASCULAR INTERVENTION;  Surgeon: Margherita Shell, MD;  Location: [facility];  Service: Cardiovascular;  Laterality: Left;   TOTAL HIP ARTHROPLASTY  05/30/2011   Procedure: TOTAL HIP ARTHROPLASTY ANTERIOR APPROACH;  Surgeon: Arnie Lao;  Location: [facility];  Service: Orthopedics;  Laterality: Right;  Right Total Hip Arthroplasty, Direct Anterior Approach (C-Arm)   Patient Active Problem List   Diagnosis Date Noted   Bilateral lower extremity edema 01/01/2022   Alopecia areata 08/04/2018   Hyperlipidemia  06/02/2018   Preventative health care 04/08/2017   COPD exacerbation (HCC) 10/05/2012   Snoring 10/05/2012   Tobacco use disorder 10/05/2012   Avascular necrosis of right femoral head (HCC) 05/30/2011    PCP: Gabriel John, NP  REFERRING PROVIDER: Arnie Lao, MD  REFERRING DIAG:  Diagnosis  [phone] (ICD-10-CM) - Chronic pain of left knee  M25.552 (ICD-10-CM) - Pain of left hip  Z96.641 (ICD-10-CM) - History of total right hip replacement  E66.01 (ICD-10-CM) - Severe obesity (BMI >= 40) (HCC)  M70.61 (ICD-10-CM) - Trochanteric bursitis, right hip    THERAPY DIAG:  Difficulty in walking, not elsewhere classified  Muscle weakness (generalized)  Chronic pain of left knee  Bilateral hip pain  Localized edema  Rationale for Evaluation and Treatment: Rehabilitation  ONSET DATE: Over a year, saw Dr. Lucienne Ryder a few weeks ago due to gradual worsening  SUBJECTIVE:   SUBJECTIVE STATEMENT: Pt reported feeling sore the day of 2 visits ago after use of percussive device but didn't report any specific improvement from it.  Pt indicated pain "comes and goes".  Pt indicated getting up after sitting down and walking long distances were noted as trouble.    Pt indicated having some complaints in knee last night.  Pt indicated shot in Lt knee that helped some.  Supposed to have another one in hip as well.  Pt indicated a great deal better +6.   Reported 75% overall improvement back to normal.   PERTINENT HISTORY: Rt THA in 2012, previous left knee scope, COPD, HLD, HTN, peripheral arterial disease  PAIN:  NPRS scale: 3-4/10 at most today.  Pain location: Left knee and both hips Pain description: Achy, sore, less constant Aggravating factors: Transition from prolonged sitting to standing, Walking and ascending stairs Relieving factors: Cortisone shot, take it easy  PRECAUTIONS: None  RED FLAGS: None   WEIGHT BEARING RESTRICTIONS: No  FALLS:  Has  patient fallen in last 6 months? 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  DIAGNOSTIC FINDINGS: An AP pelvis and lateral both hips shows a well-seated right total hip  arthroplasty with no complicating features.  The left hip joint space is  well-maintained.  2 views of the left knee show tricompartment arthritis with medial and  lateral joint space narrowing as well as patellofemoral narrowing.  There  are osteophytes in all 3 compartments.  PATIENT SURVEYS:  07/15/2023: FOTO update: 66  06/19/2023 FOTO 54 (Goal 62 in 11 visits)  COGNITION: 06/19/2023 Overall cognitive status: Within functional limits for tasks assessed     SENSATION: 06/19/2023 No complaints of peripheral pain or paresthesias  EDEMA:  06/19/2023 Edema is noted of the left knee although no objective measures were taken  MUSCLE LENGTH: 07/08/2023 Hamstrings: Right 35 deg; Left 40 deg  06/19/2023 Hamstrings: Right 35 deg; Left 35 deg  LOWER EXTREMITY ROM:  Passive ROM Left/Right 06/19/2023 Left/Right 07/08/2023  Hip flexion 75/75 80/80  Hip extension    Hip abduction    Hip adduction    Hip internal rotation 13/14 17/12  Hip external rotation 17/19 27/16  Knee flexion 129/132   Knee extension 0/0   Ankle dorsiflexion    Ankle plantarflexion    Ankle inversion    Ankle eversion     (Blank rows = not tested)  LOWER EXTREMITY MMT:  MMT out of 5 Left/Right 06/19/2023   Hip flexion    Hip extension    Hip abduction 4/4-   Hip adduction    Hip internal rotation    Hip external rotation     Knee flexion    Knee extension 4+/4   Ankle dorsiflexion    Ankle plantarflexion    Ankle inversion    Ankle eversion     (Blank rows = not tested)  Lumbar ROM:  07/03/2023 Passive extension 75% normal range of motion  GAIT: 06/19/2023 Distance walked: 100 feet Assistive device utilized: None Level of assistance: Complete Independence Comments: Elijio walks with a very stiff gait, particularly with the first few steps after standing.  He notes particular difficulty with prolonged walking and ascending stairs.  TREATMENT:        DATE: 07/15/2023:   Therex: Nustep Lvl 6 9 mins UE/LE Supine figure 4 push away 30 sec x 2 bilateral  SKC towel assisted 30 sec x 2 bilateral Supine hooklying bridge 2 x 10  Supine hooklying clam shell blue band 2 x 10 each Supine SLR 2 x 10 bilateral  Standing hip hike 2-3 sec hold x 10  bilateral   Verbal review of existing and POC options.  Discussed led to desire to trial HEP at this time.     07/08/2023 Nu Step Level 7 for 8 minutes lower extremity only Seated straight leg raises 3 sets of 5 for 2 seconds Bridging 10 x 5 seconds Supine figure 4 stretch 4 x 20 seconds (much better than at evaluation) Single knee-to-chest stretch 4 x 20 seconds with strap Standing hip hike 2 sets of 10 for 3 seconds Side-lying clams with resistance red Thera-Band 10 x 5 seconds bilaterally  Completed and objective reassessment of hip active range of motion, reviewed anatomy of the hip and knee and of the importance of strengthening quadriceps and hip abductors musculature specifically   07/03/2023 Therex: Nustep UE/LE   Heat applied to lower lumbar during supine stretching to address stiffness and facilitate myofacial release Knee to chest 2x30ec ea with towel  Supine LB rotation stretch 2x30sec  Additional time spent in review of existin HEP and techniques with question and answer time provided to improve knowledge and home use for symptom  management  Manual: Percussion device soft tissue mobilization to QL, R lumbar paraspinals, R glute med and max to tolerance     06/25/2023 Nustep UE/LE  Knee to chest 2x30ec Supine LB rotation stretch 2x30sec Bridge 2x10 with 5sec hold vc for proper form and reduction of cramping Supine bent knee fallout with BTB  2x15ea STS at 23in high with controlled eccentric x20 Seated SLR with quad set x15; vc for controlled lowering for prevention of extensor lag    PATIENT EDUCATION:  07/03/2023 Patient educated on options for muscle releases via theragun/dry needling/tennis ball rolling 06/19/2023 Education details: See above Person educated: Patient Education method: Explanation, Demonstration, Tactile cues, Verbal cues, and Handouts Education comprehension: verbalized understanding, returned demonstration, verbal cues required, tactile cues required, and needs further education  HOME EXERCISE PROGRAM: Access Code: [facility] URL: [email or web address] Date: 07/08/2023 Prepared by: Terral Ferrari  Exercises - Single Knee to Chest Stretch  - 2-3 x daily - 7 x weekly - 1 sets - 5 reps - 20 seconds hold - Supine Lower Trunk Rotation  - 2-3 x daily - 1-7 x weekly - 1 sets - 10 reps - 10 seconds hold - Supine Quadriceps Sets  - 3-5 x daily - 1-7 x weekly - 2-3 sets - 10 reps - 5 second hold - Supine Figure 4 Piriformis Stretch  - 2-3 x daily - 7 x weekly - 1 sets - 5 reps - 20 seconds hold - Yoga Bridge  - 1 x daily - 7 x weekly - 2 sets - 10 reps - 5 seconds hold - Small Range Straight Leg Raise  - 2 x daily - 7 x weekly - 3-5 sets - 5 reps - 3 seconds hold - Standing Hip Hiking  - 1 x daily - 7 x weekly - 2 sets - 5 reps - 3 seconds hold  ASSESSMENT:  CLINICAL IMPRESSION: The patient has attended 6 visits over the course of treatment cycle.  Patient has reported overall improvement at +6 with global rating of change at 75%.  See objective data above for updated  information regarding current presentation.  Pt and clinician discussed option for HEP trial due to cost and general improvement reported to this point.  Pt expressed desire at this time.  Reviewed HEP for trial HEP.  Will have injection in hip tomorrow.     OBJECTIVE IMPAIRMENTS: Abnormal gait, decreased activity  tolerance, decreased endurance, decreased knowledge of condition, decreased mobility, difficulty walking, decreased ROM, decreased strength, increased edema, impaired perceived functional ability, impaired flexibility, obesity, and pain.   ACTIVITY LIMITATIONS: carrying, lifting, bending, sitting, standing, squatting, sleeping, stairs, and locomotion level  PARTICIPATION LIMITATIONS: community activity and occupation  PERSONAL FACTORS: Rt THA in 2012, previous left knee scope, COPD, HLD, HTN, peripheral arterial disease are also affecting patient's functional outcome.   REHAB POTENTIAL: Good  CLINICAL DECISION MAKING: Stable/uncomplicated  EVALUATION COMPLEXITY: Low   GOALS: Goals reviewed with patient? Yes  SHORT TERM GOALS: Target date: 07/17/2023 Slayter will be independent with his day 1 home exercise program Baseline: Started 06/19/2023 Goal status: Met 07/08/2023  2.  Improve bilateral lower extremity flexibility for hip flexors to 90 degrees; hamstrings to 45 degrees and hip external rotation to at least 30 degrees Baseline: 75/75; 35/35 and 17/19 respectively Goal status:  On Going 07/08/2023   LONG TERM GOALS: Target date: 08/14/2023  Improve FOTO to 62 in 11 visits Baseline: 54 Goal status: Met 07/15/2023  2.  Davie will report bilateral hip and left knee pain consistently 0-3/10 on the visual analog scale Baseline: 2-4/10 Goal status: On Going 07/08/2023  3.  Improve bilateral hip external rotation AROM to at least 35 degrees Baseline: 17/19 degrees Goal status: On Going 07/08/2023  4.  Improve bilateral hip abductors and quadricep strength to at least 5-/5  MMT Baseline: 4- to 4+ MMT Goal status: on going 07/15/2023  5.  Krystal will report significant improvements in his ability to walk longer distances and ascend stairs Baseline: Significantly limited at evaluation Goal status:  on going 07/15/2023  6.  Stevon will be independent with his long-term home exercise program at discharge Baseline: Started 06/19/2023 Goal status:  met  07/15/2023   PLAN:  PT FREQUENCY: 1-2x/week  PT DURATION: 8 weeks  PLANNED INTERVENTIONS: 97110-Therapeutic exercises, 97530- Therapeutic activity, 97112- Neuromuscular re-education, 97535- Self Care, 16109- Manual therapy, [phone]- Gait training, Patient/Family education, Balance training, Stair training, Dry Needling, and Cryotherapy, Vasopneumatic  PLAN FOR NEXT SESSION: Trial HEP, return prn. Discharge after 30 days inactivity.   Bonna Bustard, PT, DPT, OCS, ATC 07/15/23  4:37 PM   PHYSICAL THERAPY DISCHARGE SUMMARY  Visits from Start of Care: 5  Current functional level related to goals / functional outcomes: See note   Remaining deficits: See note   Education / Equipment: HEP  Patient goals were partially met. Patient is being discharged due to not returning since the last visit.  Bonna Bustard, PT, DPT, OCS, ATC 09/30/23  8:06 AM

## 2023-07-16 ENCOUNTER — Other Ambulatory Visit: Payer: Self-pay

## 2023-07-16 ENCOUNTER — Ambulatory Visit: Payer: 59 | Admitting: Physical Medicine and Rehabilitation

## 2023-07-16 DIAGNOSIS — M25552 Pain in left hip: Secondary | ICD-10-CM

## 2023-07-16 MED ORDER — BUPIVACAINE HCL 0.25 % IJ SOLN
4.0000 mL | INTRAMUSCULAR | Status: AC | PRN
  Administered 2023-07-16: 4 mL via INTRA_ARTICULAR

## 2023-07-16 MED ORDER — TRIAMCINOLONE ACETONIDE 40 MG/ML IJ SUSP
40.0000 mg | INTRAMUSCULAR | Status: AC | PRN
  Administered 2023-07-16: 40 mg via INTRA_ARTICULAR

## 2023-07-16 NOTE — Progress Notes (Signed)
Jerome Thomas - 58 y.o. male MRN [id number]  Date of birth: [birth year]  Office Visit Note: Visit Date: 07/16/2023 PCP: Doreene Nest, NP Referred by: Doreene Nest, NP  Subjective: Chief Complaint  Patient presents with   Left Hip - Pain   HPI:  Jerome Thomas is a 58 y.o. male who comes in today at the request of Dr. Doneen Poisson for planned Left anesthetic hip arthrogram with fluoroscopic guidance.  The patient has failed conservative care including home exercise, medications, time and activity modification.  This injection will be diagnostic and hopefully therapeutic.  Please see requesting physician notes for further details and justification.   ROS Otherwise per HPI.  Assessment & Plan: Visit Diagnoses:    ICD-10-CM   1. Pain in left hip  M25.552 XR C-ARM NO REPORT      Plan: No additional findings.   Meds & Orders: No orders of the defined types were placed in this encounter.   Orders Placed This Encounter  Procedures   Large Joint Inj: L hip joint   XR C-ARM NO REPORT    Follow-up: Return if symptoms worsen or fail to improve.   Procedures: Large Joint Inj: L hip joint on 07/16/2023 3:34 PM Indications: diagnostic evaluation and pain Details: 22 G 3.5 in needle, fluoroscopy-guided anterior approach  Arthrogram: No  Medications: 4 mL bupivacaine 0.25 %; 40 mg triamcinolone acetonide 40 MG/ML Outcome: tolerated well, no immediate complications  There was excellent flow of contrast producing a partial arthrogram of the hip. The patient did have relief of symptoms during the anesthetic phase of the injection. Procedure, treatment alternatives, risks and benefits explained, specific risks discussed. Consent was given by the patient. Immediately prior to procedure a time out was called to verify the correct patient, procedure, equipment, support staff and site/side marked as required. Patient was prepped and draped in the usual sterile fashion.           Clinical History: No specialty comments available.     Objective:  VS:  HT:    WT:   BMI:     BP:   HR: bpm  TEMP: ( )  RESP:  Physical Exam Vitals and nursing note reviewed.  Constitutional:      General: He is not in acute distress.    Appearance: Normal appearance. He is well-developed. He is obese.  HENT:     Head: Normocephalic and atraumatic.  Eyes:     Conjunctiva/sclera: Conjunctivae normal.     Pupils: Pupils are equal, round, and reactive to light.  Cardiovascular:     Rate and Rhythm: Normal rate.     Pulses: Normal pulses.     Heart sounds: Normal heart sounds.  Pulmonary:     Effort: Pulmonary effort is normal. No respiratory distress.  Musculoskeletal:        General: Tenderness present.     Cervical back: Normal range of motion and neck supple. No rigidity.     Right lower leg: No edema.     Left lower leg: No edema.     Comments: Pain with hip rotation  Skin:    General: Skin is warm and dry.     Findings: No erythema or rash.  Neurological:     General: No focal deficit present.     Mental Status: He is alert and oriented to person, place, and time.     Sensory: No sensory deficit.     Coordination: Coordination normal.  Gait: Gait normal.  Psychiatric:        Mood and Affect: Mood normal.        Behavior: Behavior normal.      Imaging: No results found.

## 2023-08-19 ENCOUNTER — Ambulatory Visit: Payer: 59 | Admitting: Orthopaedic Surgery

## 2023-08-19 ENCOUNTER — Encounter: Payer: Self-pay | Admitting: Orthopaedic Surgery

## 2023-08-19 VITALS — Ht 76.0 in | Wt 345.0 lb

## 2023-08-19 DIAGNOSIS — M25552 Pain in left hip: Secondary | ICD-10-CM

## 2023-08-19 NOTE — Progress Notes (Signed)
 The patient is a 58 year old gentleman well-known to [place].  We replaced his right hip remotely.  He is morbidly obese and today his BMI is 41.99.  We sent him recently to Dr. Alvester Morin for a left hip intra-articular injection with a steroid.  He says that it is really been helpful for him.  His left hip moves smoothly and fluidly.  There is no pain in the groin today.  His previous x-rays showed joint space narrowing but not bone-on-bone wear and just yet but is getting close.  He knows to wait at least 6 months between steroid injections in his left hip joint.  I think if he really lost significant weight that would help him the most and he could avoid joint replacement surgery.  All question concerns were answered addressed.  Follow-up right now is as needed since he is doing well.

## 2023-11-24 ENCOUNTER — Other Ambulatory Visit: Payer: Self-pay | Admitting: Physician Assistant

## 2023-12-08 ENCOUNTER — Ambulatory Visit (INDEPENDENT_AMBULATORY_CARE_PROVIDER_SITE_OTHER)
Admission: RE | Admit: 2023-12-08 | Discharge: 2023-12-08 | Disposition: A | Discharge status: Home / Self Care | Source: Ambulatory Visit | Attending: Primary Care

## 2023-12-08 ENCOUNTER — Encounter: Payer: Self-pay | Admitting: Primary Care

## 2023-12-08 ENCOUNTER — Ambulatory Visit (INDEPENDENT_AMBULATORY_CARE_PROVIDER_SITE_OTHER): Admitting: Primary Care

## 2023-12-08 VITALS — BP 128/70 | HR 73 | Temp 97.5°F | Ht 76.0 in | Wt 346.0 lb

## 2023-12-08 DIAGNOSIS — R600 Localized edema: Secondary | ICD-10-CM

## 2023-12-08 DIAGNOSIS — R0609 Other forms of dyspnea: Secondary | ICD-10-CM | POA: Insufficient documentation

## 2023-12-08 DIAGNOSIS — R7989 Other specified abnormal findings of blood chemistry: Secondary | ICD-10-CM

## 2023-12-08 DIAGNOSIS — R7303 Prediabetes: Secondary | ICD-10-CM | POA: Insufficient documentation

## 2023-12-08 DIAGNOSIS — L03116 Cellulitis of left lower limb: Secondary | ICD-10-CM | POA: Insufficient documentation

## 2023-12-08 DIAGNOSIS — I739 Peripheral vascular disease, unspecified: Secondary | ICD-10-CM | POA: Insufficient documentation

## 2023-12-08 DIAGNOSIS — T148XXA Other injury of unspecified body region, initial encounter: Secondary | ICD-10-CM

## 2023-12-08 DIAGNOSIS — E785 Hyperlipidemia, unspecified: Secondary | ICD-10-CM | POA: Diagnosis not present

## 2023-12-08 LAB — COMPREHENSIVE METABOLIC PANEL WITH GFR
ALT: 33 U/L (ref 0–53)
AST: 16 U/L (ref 0–37)
Albumin: 4.1 g/dL (ref 3.5–5.2)
Alkaline Phosphatase: 104 U/L (ref 39–117)
BUN: 14 mg/dL (ref 6–23)
CO2: 33 meq/L — ABNORMAL HIGH (ref 19–32)
Calcium: 9.6 mg/dL (ref 8.4–10.5)
Chloride: 101 meq/L (ref 96–112)
Creatinine, Ser: 1.09 mg/dL (ref 0.40–1.50)
GFR: 75.32 mL/min (ref >60.00)
Glucose, Bld: 86 mg/dL (ref 70–99)
Potassium: 4.3 meq/L (ref 3.5–5.1)
Sodium: 140 meq/L (ref 135–145)
Total Bilirubin: 0.6 mg/dL (ref 0.2–1.2)
Total Protein: 7 g/dL (ref 6.0–8.3)

## 2023-12-08 LAB — CBC WITH DIFFERENTIAL/PLATELET
Basophils Absolute: 0 10*3/uL (ref 0.0–0.1)
Basophils Relative: 0.4 % (ref 0.0–3.0)
Eosinophils Absolute: 0.1 10*3/uL (ref 0.0–0.7)
Eosinophils Relative: 1.5 % (ref 0.0–5.0)
HCT: 53.1 % — ABNORMAL HIGH (ref 39.0–52.0)
Hemoglobin: 17.3 g/dL — ABNORMAL HIGH (ref 13.0–17.0)
Lymphocytes Relative: 21.4 % (ref 12.0–46.0)
Lymphs Abs: 1.9 10*3/uL (ref 0.7–4.0)
MCHC: 32.7 g/dL (ref 30.0–36.0)
MCV: 86.8 fl (ref 78.0–100.0)
Monocytes Absolute: 0.7 10*3/uL (ref 0.1–1.0)
Monocytes Relative: 8.5 % (ref 3.0–12.0)
Neutro Abs: 6 10*3/uL (ref 1.4–7.7)
Neutrophils Relative %: 68.2 % (ref 43.0–77.0)
Platelets: 239 10*3/uL (ref 150.0–400.0)
RBC: 6.11 Mil/uL — ABNORMAL HIGH (ref 4.22–5.81)
RDW: 14.4 % (ref 11.5–15.5)
WBC: 8.7 10*3/uL (ref 4.0–10.5)

## 2023-12-08 LAB — LIPID PANEL
Cholesterol: 161 mg/dL (ref 0–200)
HDL: 44 mg/dL (ref >39.00)
LDL Cholesterol: 83 mg/dL (ref 0–99)
NonHDL: 117.37
Total CHOL/HDL Ratio: 4
Triglycerides: 172 mg/dL — ABNORMAL HIGH (ref 0.0–149.0)
VLDL: 34.4 mg/dL (ref 0.0–40.0)

## 2023-12-08 LAB — BRAIN NATRIURETIC PEPTIDE: Pro B Natriuretic peptide (BNP): 53 pg/mL (ref 0.0–100.0)

## 2023-12-08 LAB — HEMOGLOBIN A1C: Hgb A1c MFr Bld: 6.3 % (ref 4.6–6.5)

## 2023-12-08 MED ORDER — SULFAMETHOXAZOLE-TRIMETHOPRIM 800-160 MG PO TABS: 1.0000 | ORAL_TABLET | Freq: Two times a day (BID) | ORAL | 0 refills | Status: AC

## 2023-12-08 NOTE — Assessment & Plan Note (Signed)
 Repeat A1c pending

## 2023-12-08 NOTE — Assessment & Plan Note (Addendum)
 Also with questionable ulcer development, likely secondary to PAD.  Labs pending today including CBC with differential and A1c.  Start Bactrim DS tablets, 1 tablet twice daily x 10 days. Referral placed to wound center due to suspected ulcer formation and chronicity of wound.

## 2023-12-08 NOTE — Progress Notes (Addendum)
 Subjective:    Patient ID: Jerome Thomas, male    DOB: July 03, 1965, 58 y.o.   MRN: [id number]  Wound Check    RAINEY KAHRS is a very pleasant 58 y.o. male with a history of avascular necrosis of the right femoral head, tobacco use disorder, hyperlipidemia, lower extremity edema, prediabetes who presents today to discuss several concerns.  He has not been seen by me since July 2023.  His wife joins us  today.  1) Lower Extremity Wound: He first noticed a dark spot to the left anterior lower extremity. The spot increased in size over the last one year. About 2 weeks ago he noticed burning and redness around the black spot. Two days ago he noticed another dark spot with burning sensation to the left posterior lower extremity.   He denies numbness/tingling, injury/trauma. He does have blood on the sheets at night. He does notice bleeding after a shower.     2) Hyperlipidemia: Currently managed on rosuvastatin  10 mg daily and clopidogrel  75 mg daily per pain and vascular services.  Last office visit with them was in December 2024, blood flow studies without changes or acute concern.  His rosuvastatin  prescription was renewed but he was told to follow-up with PCP for additional refills and labs.  Today he is compliant to his rosuvastatin  and clopidogrel . He is running low on rosuvastatin . He denies chest pain. His wife mentions that he does experience shortness of breath with mild to moderate exertion.  He does have chronic bilateral lower extremity edema which has not improved in the morning when waking.  He works standing all day as a Curator. He is a smoker since the age of 51, at his heaviest he was smoking 2 PPD, now smoking 1.5 PPD.     Review of Systems  Constitutional:  Negative for fever.  Respiratory:  Positive for shortness of breath.   Cardiovascular:  Positive for leg swelling. Negative for chest pain.  Skin:  Positive for color change and wound.         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   1 child.    Works as a Curator.   Enjoys riding 4-wheelers.    Social Drivers of Corporate investment banker Strain: Not on [employer]  Insecurity: Not on file  Transportation Needs: Not on file  Physical Activity: Not on file  Stress: Not on file  Social Connections: Unknown (10/28/2021)   Received from [facility]   Social Network    Social Network: Not on file  Intimate Partner Violence: Unknown (09/19/2021)   Received from [facility]   HITS    Physically Hurt: Not on file    Insult or Talk Down To: Not on file    Threaten Physical Harm: Not on file    Scream or Curse: Not on file    Past Surgical History:  Procedure Laterality Date   ABDOMINAL AORTOGRAM W/LOWER EXTREMITY N/A 03/25/2022   Procedure: ABDOMINAL AORTOGRAM W/LOWER EXTREMITY;  Surgeon: Serene Gaile ORN, MD;  Location: [facility];  Service: Cardiovascular;  Laterality: N/A;   ARTHROSCOPY KNEE W/  DRILLING  1980's   left knee   HERNIA REPAIR  yrs ago   x 2   PERIPHERAL VASCULAR INTERVENTION Left 03/25/2022   Procedure: PERIPHERAL VASCULAR INTERVENTION;  Surgeon: Serene Gaile ORN, MD;  Location: [facility];  Service: Cardiovascular;  Laterality: Left;   TOTAL HIP ARTHROPLASTY  05/30/2011   Procedure: TOTAL HIP ARTHROPLASTY ANTERIOR APPROACH;  Surgeon: Lonni CINDERELLA Poli;  Location: [facility];  Service: Orthopedics;  Laterality: Right;  Right Total Hip Arthroplasty, Direct Anterior Approach (C-Arm)    Family History  Problem Relation Age of Onset   Arthritis Father    Colon cancer Neg Hx    Esophageal cancer Neg Hx    Liver cancer Neg Hx    Pancreatic cancer Neg Hx    Rectal cancer Neg Hx    Stomach cancer Neg Hx    Prostate cancer Neg Hx     No Known Allergies  Current Outpatient Medications on File Prior to Visit  Medication Sig Dispense Refill   clopidogrel  (PLAVIX ) 75 MG tablet Take 1 tablet by mouth once daily 30 tablet 11   rosuvastatin  (CRESTOR ) 10 MG tablet Take 1 tablet (10 mg total) by mouth daily. 30 tablet 5   albuterol  (VENTOLIN  HFA) 108 (90 Base) MCG/ACT inhaler Inhale 2 puffs into the lungs every 6 (six) hours as needed for wheezing or shortness of breath. (Patient not taking: Reported on 12/08/2023) 8 g 2   No current facility-administered medications on file prior to visit.    BP 128/70   Pulse 73   Temp (!) 97.5 F (36.4 C) (Temporal)   Ht 6' 4 (1.93 m)   Wt (!) 346 lb (156.9 kg)   SpO2 93%   BMI 42.12 kg/m  Objective:   Physical Exam  Cardiovascular:     Rate and Rhythm: Normal rate and regular rhythm.  Pulmonary:     Effort: Pulmonary effort is normal.     Breath sounds: Normal breath sounds.   Musculoskeletal:     Cervical back: Neck supple.   Skin:    General: Skin is warm and dry.     Findings: Erythema present.     Comments: 0.5 cm scabbed wound to left anterior lower portion of lower extremity. Surrounding erythema.  No  drainage.  0.25 cm scabbed wound to the left posterior lower portion of lower extremity.  Mild surrounding erythema.  No drainage.   Neurological:     Mental Status: He is alert and oriented to person, place, and time.   Psychiatric:        Mood and Affect: Mood normal.  Assessment & Plan:  Bilateral lower extremity edema Assessment & Plan: Evident on exam today.  Following with pain and vascular, office notes reviewed from December 2024.  Checking labs today including BNP and BMP, especially in the setting of exertional dyspnea.  Orders: -     Brain natriuretic peptide  Cellulitis of left lower extremity Assessment & Plan: Also with questionable ulcer development, likely secondary to PAD.  Labs pending today including CBC with differential and A1c.  Start Bactrim DS tablets, 1 tablet twice daily x 10 days. Referral placed to wound center due to suspected ulcer formation and chronicity of wound.  Orders: -     CBC with Differential/Platelet -     Sulfamethoxazole-Trimethoprim; Take 1 tablet by mouth 2 (two) times daily.  Dispense: 20 tablet; Refill: 0 -     Ambulatory referral to Wound Clinic  Exertional dyspnea Assessment & Plan: Differentials include CHF, COPD, obesity  Labs pending today including BNP, BMP, CBC. Chest x-ray ordered pending.  Orders: -     Comprehensive metabolic panel with GFR -     Brain natriuretic peptide -     DG Chest 2 View  Hyperlipidemia, unspecified hyperlipidemia type Assessment & Plan: Repeat lipid panel pending.  Continue rosuvastatin  10 mg daily for now.  Orders: -     Lipid panel  Prediabetes Assessment & Plan: Repeat A1c pending.  Orders: -     Hemoglobin A1c  Chronic wound -     Ambulatory referral to Wound Clinic  PAD (peripheral artery disease) (HCC) Assessment & Plan: Following with vascular services. Office notes reviewed from December 2024.  He may be experiencing a complication of PAD with  his chronic lower extremity wound. Will treat for presumed cellulitis superimposed to chronic ulcer concern.   Referral placed to wound clinic.         Maylee Bare K Afreen Siebels, NP

## 2023-12-08 NOTE — Assessment & Plan Note (Signed)
 Evident on exam today.  Following with pain and vascular, office notes reviewed from December 2024.  Checking labs today including BNP and BMP, especially in the setting of exertional dyspnea.

## 2023-12-08 NOTE — Patient Instructions (Addendum)
 Complete xray(s) and lab prior to leaving today. I will notify you of your results once received.  You will either be contacted via phone regarding your referral to the wound clinic, or you may receive a letter on your MyChart portal from our referral team with instructions for scheduling an appointment. Please let us  know if you have not been contacted by anyone within two weeks.  Start Bactrim DS antibiotic tablets for the wound.  Take 1 tablet by mouth twice daily x 10 days.  Take with food.  It was a pleasure to see you today!

## 2023-12-08 NOTE — Assessment & Plan Note (Signed)
 Differentials include CHF, COPD, obesity  Labs pending today including BNP, BMP, CBC. Chest x-ray ordered pending.

## 2023-12-08 NOTE — Assessment & Plan Note (Signed)
 Repeat lipid panel pending.  Continue rosuvastatin  10 mg daily for now.

## 2023-12-08 NOTE — Assessment & Plan Note (Signed)
 Following with vascular services. Office notes reviewed from December 2024.  He may be experiencing a complication of PAD with his chronic lower extremity wound. Will treat for presumed cellulitis superimposed to chronic ulcer concern.   Referral placed to wound clinic.

## 2023-12-09 ENCOUNTER — Ambulatory Visit: Payer: Self-pay | Admitting: Primary Care

## 2023-12-09 ENCOUNTER — Ambulatory Visit (INDEPENDENT_AMBULATORY_CARE_PROVIDER_SITE_OTHER)

## 2023-12-09 ENCOUNTER — Other Ambulatory Visit: Payer: Self-pay | Admitting: Primary Care

## 2023-12-09 DIAGNOSIS — R7989 Other specified abnormal findings of blood chemistry: Secondary | ICD-10-CM

## 2023-12-09 LAB — IBC + FERRITIN
Ferritin: 112.3 ng/mL (ref 22.0–322.0)
Iron: 92 ug/dL (ref 42–165)
Saturation Ratios: 24 % (ref 20.0–50.0)
TIBC: 383.6 ug/dL (ref 250.0–450.0)
Transferrin: 274 mg/dL (ref 212.0–360.0)

## 2023-12-09 NOTE — Addendum Note (Signed)
 Addended by: ISADORA RAISIN on: 12/09/2023 11:53 AM   Modules accepted: Orders

## 2023-12-11 ENCOUNTER — Other Ambulatory Visit: Payer: Self-pay | Admitting: Primary Care

## 2023-12-11 ENCOUNTER — Telehealth: Payer: Self-pay | Admitting: Primary Care

## 2023-12-11 DIAGNOSIS — E785 Hyperlipidemia, unspecified: Secondary | ICD-10-CM

## 2023-12-11 LAB — PERIPHERAL BLOOD SMEAR REVIEW

## 2023-12-11 LAB — EXTRA LAV TOP TUBE

## 2023-12-11 MED ORDER — ROSUVASTATIN CALCIUM 10 MG PO TABS: 10.0000 mg | ORAL_TABLET | Freq: Every day | ORAL | 3 refills | Status: AC

## 2023-12-11 NOTE — Telephone Encounter (Unsigned)
 Copied from CRM [id number]. Topic: Clinical - Medication Refill >> Dec 11, 2023  1:48 PM Suzen RAMAN wrote: Medication: rosuvastatin  (CRESTOR ) 10 MG tablet  Has the patient contacted their pharmacy? Yes  This is the patient's preferred pharmacy:  [facility] [address] [place] - [address] [place] [address] Phone: [phone] Fax: [phone]  Is this the correct pharmacy for this prescription? Yes If no, delete pharmacy and type the correct one.   Has the prescription been filled recently? No  Is the patient out of the medication? No (patient only has 2 pills)  Has the patient been seen for an appointment in the last year OR does the patient have an upcoming appointment? Yes  Can we respond through MyChart? Yes  Agent: Please be advised that Rx refills may take up to 3 business days. We ask that you follow-up with your pharmacy.

## 2023-12-11 NOTE — Telephone Encounter (Signed)
 Copied from CRM [id number]. Topic: Compliment - Provider (non-sensitive) >> Dec 11, 2023  1:50 PM Suzen RAMAN wrote: Date of encounter: 12/11/23 Details of compliment: Patient wife would like to think Comer Gaskins for getting her husband into the wound clinic as soon as she did. Who would the patient like to thank/see rewarded? Comer Gaskins On a scale of 1-10, how was your experience? 10  Route to Research officer, political party.

## 2023-12-11 NOTE — Telephone Encounter (Signed)
 That is so kind! Happy to help!

## 2023-12-14 ENCOUNTER — Encounter: Attending: Physician Assistant | Admitting: Physician Assistant

## 2023-12-14 DIAGNOSIS — L97821 Non-pressure chronic ulcer of other part of left lower leg limited to breakdown of skin: Secondary | ICD-10-CM | POA: Insufficient documentation

## 2023-12-14 DIAGNOSIS — I10 Essential (primary) hypertension: Secondary | ICD-10-CM | POA: Diagnosis not present

## 2023-12-14 DIAGNOSIS — Z7901 Long term (current) use of anticoagulants: Secondary | ICD-10-CM | POA: Insufficient documentation

## 2023-12-14 DIAGNOSIS — F17218 Nicotine dependence, cigarettes, with other nicotine-induced disorders: Secondary | ICD-10-CM | POA: Diagnosis not present

## 2023-12-14 DIAGNOSIS — I87313 Chronic venous hypertension (idiopathic) with ulcer of bilateral lower extremity: Secondary | ICD-10-CM | POA: Insufficient documentation

## 2023-12-14 DIAGNOSIS — I2510 Atherosclerotic heart disease of native coronary artery without angina pectoris: Secondary | ICD-10-CM | POA: Insufficient documentation

## 2023-12-21 ENCOUNTER — Encounter: Attending: Physician Assistant | Admitting: Physician Assistant

## 2023-12-22 NOTE — Telephone Encounter (Signed)
 Filled by PCP

## 2023-12-29 ENCOUNTER — Other Ambulatory Visit: Payer: Self-pay

## 2023-12-29 ENCOUNTER — Ambulatory Visit: Payer: Self-pay

## 2023-12-29 ENCOUNTER — Emergency Department (HOSPITAL_COMMUNITY)
Admission: EM | Admit: 2023-12-29 | Discharge: 2023-12-29 | Disposition: A | Discharge status: Home / Self Care | Attending: Student | Admitting: Student

## 2023-12-29 ENCOUNTER — Emergency Department (HOSPITAL_COMMUNITY)

## 2023-12-29 DIAGNOSIS — R0789 Other chest pain: Secondary | ICD-10-CM | POA: Diagnosis present

## 2023-12-29 DIAGNOSIS — R062 Wheezing: Secondary | ICD-10-CM | POA: Insufficient documentation

## 2023-12-29 DIAGNOSIS — D72829 Elevated white blood cell count, unspecified: Secondary | ICD-10-CM | POA: Diagnosis not present

## 2023-12-29 DIAGNOSIS — R079 Chest pain, unspecified: Secondary | ICD-10-CM

## 2023-12-29 DIAGNOSIS — I10 Essential (primary) hypertension: Secondary | ICD-10-CM | POA: Insufficient documentation

## 2023-12-29 DIAGNOSIS — Z7951 Long term (current) use of inhaled steroids: Secondary | ICD-10-CM | POA: Diagnosis not present

## 2023-12-29 DIAGNOSIS — F17210 Nicotine dependence, cigarettes, uncomplicated: Secondary | ICD-10-CM | POA: Diagnosis not present

## 2023-12-29 DIAGNOSIS — J449 Chronic obstructive pulmonary disease, unspecified: Secondary | ICD-10-CM | POA: Insufficient documentation

## 2023-12-29 DIAGNOSIS — Z7901 Long term (current) use of anticoagulants: Secondary | ICD-10-CM | POA: Insufficient documentation

## 2023-12-29 LAB — BASIC METABOLIC PANEL WITH GFR
Anion gap: 13 (ref 5–15)
BUN: 13 mg/dL (ref 6–20)
CO2: 22 mmol/L (ref 22–32)
Calcium: 8.9 mg/dL (ref 8.9–10.3)
Chloride: 101 mmol/L (ref 98–111)
Creatinine, Ser: 0.81 mg/dL (ref 0.61–1.24)
GFR, Estimated: >60 mL/min (ref >60)
Glucose, Bld: 156 mg/dL — ABNORMAL HIGH (ref 70–99)
Potassium: 3.7 mmol/L (ref 3.5–5.1)
Sodium: 136 mmol/L (ref 135–145)

## 2023-12-29 LAB — TROPONIN I (HIGH SENSITIVITY): Troponin I (High Sensitivity): 3 ng/L (ref <18)

## 2023-12-29 LAB — HEPATIC FUNCTION PANEL
ALT: 35 U/L (ref 0–44)
AST: 25 U/L (ref 15–41)
Albumin: 3.5 g/dL (ref 3.5–5.0)
Alkaline Phosphatase: 97 U/L (ref 38–126)
Bilirubin, Direct: 0.2 mg/dL (ref 0.0–0.2)
Indirect Bilirubin: 0.9 mg/dL (ref 0.3–0.9)
Total Bilirubin: 1.1 mg/dL (ref 0.0–1.2)
Total Protein: 6.7 g/dL (ref 6.5–8.1)

## 2023-12-29 LAB — CBC
HCT: 52.5 % — ABNORMAL HIGH (ref 39.0–52.0)
Hemoglobin: 16.7 g/dL (ref 13.0–17.0)
MCH: 29 pg (ref 26.0–34.0)
MCHC: 31.8 g/dL (ref 30.0–36.0)
MCV: 91.3 fL (ref 80.0–100.0)
Platelets: 186 K/uL (ref 150–400)
RBC: 5.75 MIL/uL (ref 4.22–5.81)
RDW: 13.7 % (ref 11.5–15.5)
WBC: 11.2 K/uL — ABNORMAL HIGH (ref 4.0–10.5)
nRBC: 0 % (ref 0.0–0.2)

## 2023-12-29 LAB — BRAIN NATRIURETIC PEPTIDE: B Natriuretic Peptide: 47.7 pg/mL (ref 0.0–100.0)

## 2023-12-29 MED ORDER — IOHEXOL 350 MG/ML SOLN
75.0000 mL | Freq: Once | INTRAVENOUS | Status: AC | PRN
  Administered 2023-12-29: 75 mL via INTRAVENOUS

## 2023-12-29 MED ORDER — METHYLPREDNISOLONE 4 MG PO TBPK: ORAL_TABLET | ORAL | 0 refills | Status: AC

## 2023-12-29 MED ORDER — BUDESONIDE-FORMOTEROL FUMARATE 160-4.5 MCG/ACT IN AERO: 2.0000 | INHALATION_SPRAY | Freq: Two times a day (BID) | RESPIRATORY_TRACT | 12 refills | Status: AC

## 2023-12-29 MED ORDER — ALBUTEROL SULFATE HFA 108 (90 BASE) MCG/ACT IN AERS
1.0000 | INHALATION_SPRAY | Freq: Once | RESPIRATORY_TRACT | Status: AC
  Administered 2023-12-29: 1 via RESPIRATORY_TRACT
  Filled 2023-12-29: qty 6.7

## 2023-12-29 MED ORDER — HYDROCODONE-ACETAMINOPHEN 5-325 MG PO TABS
1.0000 | ORAL_TABLET | Freq: Once | ORAL | Status: AC
  Administered 2023-12-29: 1 via ORAL
  Filled 2023-12-29: qty 1

## 2023-12-29 MED ORDER — PREDNISONE 20 MG PO TABS
60.0000 mg | ORAL_TABLET | Freq: Once | ORAL | Status: AC
  Administered 2023-12-29: 60 mg via ORAL
  Filled 2023-12-29: qty 3

## 2023-12-29 MED ORDER — IPRATROPIUM-ALBUTEROL 0.5-2.5 (3) MG/3ML IN SOLN
9.0000 mL | Freq: Once | RESPIRATORY_TRACT | Status: AC
  Administered 2023-12-29: 9 mL via RESPIRATORY_TRACT
  Filled 2023-12-29: qty 3

## 2023-12-29 NOTE — ED Provider Notes (Signed)
 [place] EMERGENCY DEPARTMENT AT [facility] Provider Note  CSN: [id number] Arrival date & time: 12/29/23 9046  Chief Complaint(s) Chest Pain  HPI Jerome Thomas is a 58 y.o. male with PMH COPD, HTN, HLD, peripheral arterial disease status post stenting on Plavix  who presents Emergency Department for evaluation of chest pain.  Patient states that symptoms began at 10 PM last night with a sensation of a tightness that radiates to the right side of the chest.  States pain is worse with taking a deep breath.  States he went to sleep but was woken up with the pain at 4 AM this morning.  No exertional component to the pain and no associated diaphoresis, nausea, vomiting or shortness of breath.   Past Medical History Past Medical History:  Diagnosis Date   Allergy    Avascular necrosis (HCC)    right hip   COPD (chronic obstructive pulmonary disease) (HCC)    Genital warts    Hyperlipidemia    Hypertension    Peripheral arterial disease (HCC)    Patient Active Problem List   Diagnosis Date Noted   Cellulitis of left lower extremity 12/08/2023   Exertional dyspnea 12/08/2023   Prediabetes 12/08/2023   PAD (peripheral artery disease) (HCC) 12/08/2023   Bilateral lower extremity edema 01/01/2022   Alopecia areata 08/04/2018   Hyperlipidemia 06/02/2018   Preventative health care 04/08/2017   COPD exacerbation (HCC) 10/05/2012   Snoring 10/05/2012   Tobacco use disorder 10/05/2012   Avascular necrosis of right femoral head (HCC) 05/30/2011   Home Medication(s) Prior to Admission medications   Medication Sig Start Date End Date Taking? Authorizing Provider  albuterol  (VENTOLIN  HFA) 108 (90 Base) MCG/ACT inhaler Inhale 2 puffs into the lungs every 6 (six) hours as needed for wheezing or shortness of breath. 08/19/22  Yes Bedsole, Amy E, MD  budesonide -formoterol  (SYMBICORT ) 160-4.5 MCG/ACT inhaler Inhale 2 puffs into the lungs in the morning and at bedtime. 12/29/23  Yes  Elih Mooney, MD  clopidogrel  (PLAVIX ) 75 MG tablet Take 1 tablet by mouth once daily Patient taking differently: Take 75 mg by mouth in the morning. 03/23/23  Yes Serene Gaile ORN, MD  methylPREDNISolone  (MEDROL  DOSEPAK) 4 MG TBPK tablet Take as prescribed 12/29/23  Yes Hodge Stachnik, MD  rosuvastatin  (CRESTOR ) 10 MG tablet Take 1 tablet (10 mg total) by mouth daily. for cholesterol. Patient taking differently: Take 10 mg by mouth in the morning. Take 10 mg by mouth in the morning for cholesterol 12/11/23  Yes Clark, Katherine K, NP  sulfamethoxazole -trimethoprim  (BACTRIM  DS) 800-160 MG tablet Take 1 tablet by mouth 2 (two) times daily. Patient not taking: Reported on 12/29/2023 12/08/23   Gretta Comer POUR, NP                                                                                                                                    Past Surgical History Past  Surgical History:  Procedure Laterality Date   ABDOMINAL AORTOGRAM W/LOWER EXTREMITY N/A 03/25/2022   Procedure: ABDOMINAL AORTOGRAM W/LOWER EXTREMITY;  Surgeon: Serene Gaile ORN, MD;  Location: [facility] INVASIVE CV LAB;  Service: Cardiovascular;  Laterality: N/A;   ARTHROSCOPY KNEE W/ DRILLING  1980's   left knee   HERNIA REPAIR  yrs ago   x 2   PERIPHERAL VASCULAR INTERVENTION Left 03/25/2022   Procedure: PERIPHERAL VASCULAR INTERVENTION;  Surgeon: Serene Gaile ORN, MD;  Location: [facility] INVASIVE CV [facility];  Service: Cardiovascular;  Laterality: Left;   TOTAL HIP ARTHROPLASTY  05/30/2011   Procedure: TOTAL HIP ARTHROPLASTY ANTERIOR APPROACH;  Surgeon: Lonni CINDERELLA Poli;  Location: [facility] OR[facility];  Service: Orthopedics;  Laterality: Right;  Right Total Hip Arthroplasty, Direct Anterior Approach (C-Arm)   Family History Family History  Problem Relation Age of Onset   Arthritis Father    Colon cancer Neg Hx    Esophageal cancer Neg Hx    Liver cancer Neg Hx    Pancreatic cancer Neg Hx    Rectal cancer Neg Hx    Stomach cancer Neg Hx     Prostate cancer Neg Hx     Social History Social History   Tobacco Use   Smoking status: Every Day    Current packs/day: 0.50    Average packs/day: 0.5 packs/day for 30.0 years (15.0 ttl pk-yrs)    Types: Cigarettes   Smokeless tobacco: Never   Tobacco comments:    Using Nicotine  patches  Vaping Use   Vaping status: Former  Substance Use Topics   Alcohol use: Yes    Comment: occasional   Drug use: No   Allergies Patient has no known allergies.  Review of Systems Review of Systems  Respiratory:  Positive for chest tightness.   Cardiovascular:  Positive for chest pain.    Physical Exam Vital Signs  I have reviewed the triage vital signs BP (!) 145/85   Pulse 94   Temp 98.2 F (36.8 C) (Oral)   Resp (!) 23   Ht 6' 4 (1.93 m)   Wt (!) 145.2 kg   SpO2 94%   BMI 38.95 kg/m   Physical Exam Constitutional:      General: He is not in acute distress.    Appearance: Normal appearance.  HENT:     Head: Normocephalic and atraumatic.     Nose: No congestion or rhinorrhea.  Eyes:     General:        Right eye: No discharge.        Left eye: No discharge.     Extraocular Movements: Extraocular movements intact.     Pupils: Pupils are equal, round, and reactive to light.  Cardiovascular:     Rate and Rhythm: Normal rate and regular rhythm.     Heart sounds: No murmur heard. Pulmonary:     Effort: No respiratory distress.     Breath sounds: No wheezing or rales.  Abdominal:     General: There is no distension.     Tenderness: There is no abdominal tenderness.  Musculoskeletal:        General: Normal range of motion.     Cervical back: Normal range of motion.  Skin:    General: Skin is warm and dry.  Neurological:     General: No focal deficit present.     Mental Status: He is alert.     ED Results and Treatments Labs (all labs ordered are listed, but only abnormal results are displayed)  Labs Reviewed  BASIC METABOLIC PANEL WITH GFR - Abnormal; Notable  for the following components:      Result Value   Glucose, Bld 156 (*)    All other components within normal limits  CBC - Abnormal; Notable for the following components:   WBC 11.2 (*)    HCT 52.5 (*)    All other components within normal limits  HEPATIC FUNCTION PANEL  BRAIN NATRIURETIC PEPTIDE  TROPONIN I (HIGH SENSITIVITY)  TROPONIN I (HIGH SENSITIVITY)                                                                                                                          Radiology CT Angio Chest PE W and/or Wo Contrast Result Date: 12/29/2023 CLINICAL DATA:  Chest pain EXAM: CT ANGIOGRAPHY CHEST WITH CONTRAST TECHNIQUE: Multidetector CT imaging of the chest was performed using the standard protocol during bolus administration of intravenous contrast. Multiplanar CT image reconstructions and MIPs were obtained to evaluate the vascular anatomy. RADIATION DOSE REDUCTION: This exam was performed according to the departmental dose-optimization program which includes automated exposure control, adjustment of the mA and/or kV according to patient size and/or use of iterative reconstruction technique. CONTRAST:  75mL OMNIPAQUE  IOHEXOL  350 MG/ML SOLN COMPARISON:  August 05, 2012 FINDINGS: Cardiovascular: Satisfactory opacification of the pulmonary arteries to the segmental level. No evidence of pulmonary embolism. Normal heart size. No pericardial effusion. Mediastinum/Nodes: Lungs/Pleura: Small amount of dependent atelectasis. Otherwise normal Upper Abdomen: No significant abnormality. Left renal cysts noted, partially visualized Musculoskeletal: No compression fracture or rib fracture IMPRESSION: No pulmonary embolism or other significant abnormality Electronically Signed   By: Nancyann Burns M.D.   On: 12/29/2023 12:55   DG Chest 2 View Result Date: 12/29/2023 CLINICAL DATA:  Chest pain EXAM: CHEST - 2 VIEW COMPARISON:  December 08, 2023 FINDINGS: No focal airspace consolidation, pleural effusion, or  pneumothorax. No cardiomegaly. Tortuous aorta with aortic atherosclerosis. No acute fracture or destructive lesions. Multilevel thoracic osteophytosis. IMPRESSION: No acute cardiopulmonary abnormality. Electronically Signed   By: Rogelia Myers M.D.   On: 12/29/2023 10:58    Pertinent labs & imaging results that were available during my care of the patient were reviewed by me and considered in my medical decision making (see MDM for details).  Medications Ordered in ED Medications  HYDROcodone -acetaminophen  (NORCO/VICODIN) 5-325 MG per tablet 1 tablet (1 tablet Oral Given 12/29/23 1110)  iohexol  (OMNIPAQUE ) 350 MG/ML injection 75 mL (75 mLs Intravenous Contrast Given 12/29/23 1211)  ipratropium-albuterol  (DUONEB) 0.5-2.5 (3) MG/3ML nebulizer solution 9 mL (9 mLs Nebulization Given 12/29/23 1415)  predniSONE  (DELTASONE ) tablet 60 mg (60 mg Oral Given 12/29/23 1414)  albuterol  (VENTOLIN  HFA) 108 (90 Base) MCG/ACT inhaler 1 puff (1 puff Inhalation Given 12/29/23 1415)  Procedures Procedures  (including critical care time)  Medical Decision Making / ED Course   This patient presents to the ED for concern of chest tightness, chest pain, this involves an extensive number of treatment options, and is a complaint that carries with it a high risk of complications and morbidity.  The differential diagnosis includes ACS, Aortic Dissection, Pneumothorax, Pneumonia, Esophageal Rupture, PE, Tamponade/Pericardial Effusion, pericarditis, esophageal spasm, dysrhythmia, GERD, costochondritis.  MDM: Patient seen emergency room for evaluation of chest pain.  Physical exam with wheezing at the bases but is otherwise unremarkable.  Laboratory evaluation with leukocytosis to 11.2, high-sensitivity troponin is normal, BNP normal, chest x-ray unremarkable.  Given patient's pleurisy and  shortness of breath, PE study obtained that was reassuringly negative.  Patient given 3 DuoNebs and prednisone  and on reevaluation his wheezing has resolved and he is feeling significant proved.  On further discussion it appears that the patient works surrounded by particulates frequently at work and is not wearing a mask.  He is also smoking and recently went to cut hay in the yard.  Suspect patient's presentation may be multifactorial but I did highly encourage him to wear a mask while at work or while cutting the lawn.  Will start the patient on twice daily Symbicort  and albuterol  use for rescue inhaler.  At this time he does not meet inpatient criteria for admission will be discharged with outpatient follow-up.  Return precautions given which she voiced understanding.  I placed an outpatient pulmonology referral.  Additional history obtained: -Additional history obtained from wife -External records from outside source obtained and reviewed including: Chart review including previous notes, labs, imaging, consultation notes   Lab Tests: -I ordered, reviewed, and interpreted labs.   The pertinent results include:   Labs Reviewed  BASIC METABOLIC PANEL WITH GFR - Abnormal; Notable for the following components:      Result Value   Glucose, Bld 156 (*)    All other components within normal limits  CBC - Abnormal; Notable for the following components:   WBC 11.2 (*)    HCT 52.5 (*)    All other components within normal limits  HEPATIC FUNCTION PANEL  BRAIN NATRIURETIC PEPTIDE  TROPONIN I (HIGH SENSITIVITY)  TROPONIN I (HIGH SENSITIVITY)      EKG   EKG Interpretation Date/Time:  Tuesday December 29 2023 09:59:05 EDT Ventricular Rate:  100 PR Interval:  148 QRS Duration:  88 QT Interval:  324 QTC Calculation: 418 R Axis:   79  Text Interpretation: Sinus tachycardia  inferior morphology unchaged from previous Confirmed by Hilberto Burzynski ([id number]) on 12/29/2023 10:15:40 AM         Imaging  Studies ordered: I ordered imaging studies including chest x-ray, CT PE I independently visualized and interpreted imaging. I agree with the radiologist interpretation   Medicines ordered and prescription drug management: Meds ordered this encounter  Medications   HYDROcodone -acetaminophen  (NORCO/VICODIN) 5-325 MG per tablet 1 tablet    Refill:  0   iohexol  (OMNIPAQUE ) 350 MG/ML injection 75 mL   ipratropium-albuterol  (DUONEB) 0.5-2.5 (3) MG/3ML nebulizer solution 9 mL   predniSONE  (DELTASONE ) tablet 60 mg   albuterol  (VENTOLIN  HFA) 108 (90 Base) MCG/ACT inhaler 1 puff   budesonide -formoterol  (SYMBICORT ) 160-4.5 MCG/ACT inhaler    Sig: Inhale 2 puffs into the lungs in the morning and at bedtime.    Dispense:  1 each    Refill:  12   methylPREDNISolone  (MEDROL  DOSEPAK) 4 MG TBPK tablet    Sig: Take  as prescribed    Dispense:  1 each    Refill:  0    -I have reviewed the patients home medicines and have made adjustments as needed  Critical interventions none    Cardiac Monitoring: The patient was maintained on a cardiac monitor.  I personally viewed and interpreted the cardiac monitored which showed an underlying rhythm of: NSR  Social Determinants of Health:  Factors impacting patients care include: none   Reevaluation: After the interventions noted above, I reevaluated the patient and found that they have :improved  Co morbidities that complicate the patient evaluation  Past Medical History:  Diagnosis Date   Allergy    Avascular necrosis (HCC)    right hip   COPD (chronic obstructive pulmonary disease) (HCC)    Genital warts    Hyperlipidemia    Hypertension    Peripheral arterial disease (HCC)       Dispostion: I considered admission for this patient, but at this time he does not meet inpatient criteria for admission and will be discharged outpatient follow-up     Final Clinical Impression(s) / ED Diagnoses Final diagnoses:  Chest pain, unspecified  type  Wheezing     @PCDICTATION @    Albertina Dixon, MD 12/29/23 [phone]

## 2023-12-29 NOTE — ED Triage Notes (Signed)
 Patient to ED by POV with c/o chest pain. Pain started midnight pain is in center chest to right shoulder back and neck. Voices SOB. Denies cardiac HX. He has HX of COPD

## 2023-12-29 NOTE — Telephone Encounter (Signed)
Agree with triage recommendation

## 2023-12-29 NOTE — Telephone Encounter (Signed)
 FYI Only or Action Required?: FYI only for provider.  Patient was last seen in primary care on 12/08/2023 by Gretta Comer POUR, NP.  Called Nurse Triage reporting Chest Pain. Pain on right side into back  Symptoms began today. Awoke at 12 am with pain - pain until 3 am. Slept until 5:30 awake again to pain  Interventions attempted: Other: rest.  Symptoms are: gradually worsening.  Triage Disposition: Call EMS [phone] Now  Patient/caregiver understands and will follow disposition?: Yes - pt will go to ED now. Will call EMS if needed.                      Copied from CRM [id number]. Topic: Clinical - Red Word Triage >> Dec 29, 2023  9:02 AM Franky GRADE wrote: Red Word that prompted transfer to Nurse Triage: Patient is experiencing chest pains last night that seems to travel to the back, Last night patient woke up in pain and sat in the couch until the pain settled. While take deep breaths patient feels discomfort in the chest. Reason for Disposition  [1] Chest pain lasts > 5 minutes AND [2] age > 30 AND [3] one or more cardiac risk factors (e.g., diabetes, high blood pressure, high cholesterol, obesity with BMI 30 or higher, smoker, or strong family history of heart disease)  Answer Assessment - Initial Assessment Questions 1. LOCATION: Where does it hurt?       In right lung 2. RADIATION: Does the pain go anywhere else? (e.g., into neck, jaw, arms, back)     To the back - between spine and shoulder 3. ONSET: When did the chest pain begin? (Minutes, hours or days)      Last night 4. PATTERN: Does the pain come and go, or has it been constant since it started?  Does it get worse with exertion?      Comes and goes 5. DURATION: How long does it last (e.g., seconds, minutes, hours)     12-3:30 am. And awoke at 5:30 with the same pain 6. SEVERITY: How bad is the pain?  (e.g., Scale 1-10; mild, moderate, or severe)     8-10/10 7. CARDIAC RISK FACTORS: Do you have  any history of heart problems or risk factors for heart disease? (e.g., angina, prior heart attack; diabetes, high blood pressure, high cholesterol, smoker, or strong family history of heart disease)     Blood thinners 8. PULMONARY RISK FACTORS: Do you have any history of lung disease?  (e.g., blood clots in lung, asthma, emphysema, birth control pills)     no 9. CAUSE: What do you think is causing the chest pain?     unsure 10. OTHER SYMPTOMS: Do you have any other symptoms? (e.g., dizziness, nausea, vomiting, sweating, fever, difficulty breathing, cough)       no  Protocols used: Chest Pain-A-AH

## 2023-12-30 ENCOUNTER — Ambulatory Visit: Admitting: Physician Assistant

## 2023-12-31 ENCOUNTER — Ambulatory Visit: Admitting: Physician Assistant

## 2024-04-03 ENCOUNTER — Other Ambulatory Visit: Payer: Self-pay | Admitting: Surgery

## 2024-04-18 ENCOUNTER — Encounter: Payer: Self-pay | Admitting: Radiology

## 2024-06-29 ENCOUNTER — Other Ambulatory Visit: Payer: Self-pay | Admitting: Surgery

## 2024-06-29 NOTE — Telephone Encounter (Signed)
 Patient needs an appointment. Future refills will need to come from PCP if not seen in our  practice.
